# Patient Record
Sex: Female | Born: 1965
Health system: Southern US, Community
[De-identification: ages and names within clinical notes are randomized; demographics above are authoritative.]

## PROBLEM LIST (undated history)

## (undated) DIAGNOSIS — T7840XA Allergy, unspecified, initial encounter: Secondary | ICD-10-CM

## (undated) DIAGNOSIS — J45909 Unspecified asthma, uncomplicated: Secondary | ICD-10-CM

## (undated) DIAGNOSIS — D219 Benign neoplasm of connective and other soft tissue, unspecified: Secondary | ICD-10-CM

## (undated) DIAGNOSIS — Z789 Other specified health status: Secondary | ICD-10-CM

## (undated) HISTORY — DX: Unspecified asthma, uncomplicated: J45.909

## (undated) HISTORY — DX: Allergy, unspecified, initial encounter: T78.40XA

## (undated) HISTORY — DX: Benign neoplasm of connective and other soft tissue, unspecified: D21.9

## (undated) HISTORY — PX: WISDOM TOOTH EXTRACTION: SHX21

---

## 1998-03-29 ENCOUNTER — Other Ambulatory Visit: Admission: RE | Admit: 1998-03-29 | Discharge: 1998-03-29 | Payer: Self-pay | Admitting: Obstetrics and Gynecology

## 1999-04-03 ENCOUNTER — Other Ambulatory Visit: Admission: RE | Admit: 1999-04-03 | Discharge: 1999-04-03 | Payer: Self-pay | Admitting: Obstetrics and Gynecology

## 1999-09-22 ENCOUNTER — Ambulatory Visit (HOSPITAL_COMMUNITY): Admission: AD | Admit: 1999-09-22 | Discharge: 1999-09-22 | Payer: Self-pay | Admitting: Orthopedic Surgery

## 2000-05-15 ENCOUNTER — Other Ambulatory Visit: Admission: RE | Admit: 2000-05-15 | Discharge: 2000-05-15 | Payer: Self-pay | Admitting: Obstetrics and Gynecology

## 2001-01-18 ENCOUNTER — Encounter: Payer: Self-pay | Admitting: Emergency Medicine

## 2001-01-18 ENCOUNTER — Emergency Department (HOSPITAL_COMMUNITY): Admission: EM | Admit: 2001-01-18 | Discharge: 2001-01-18 | Payer: Self-pay | Admitting: Emergency Medicine

## 2001-05-03 ENCOUNTER — Inpatient Hospital Stay (HOSPITAL_COMMUNITY): Admission: AD | Admit: 2001-05-03 | Discharge: 2001-05-03 | Payer: Self-pay | Admitting: Obstetrics

## 2001-05-03 ENCOUNTER — Encounter: Payer: Self-pay | Admitting: *Deleted

## 2001-05-05 ENCOUNTER — Inpatient Hospital Stay (HOSPITAL_COMMUNITY): Admission: AD | Admit: 2001-05-05 | Discharge: 2001-05-05 | Payer: Self-pay | Admitting: *Deleted

## 2002-03-03 ENCOUNTER — Other Ambulatory Visit: Admission: RE | Admit: 2002-03-03 | Discharge: 2002-03-03 | Payer: Self-pay | Admitting: Internal Medicine

## 2002-05-27 ENCOUNTER — Encounter: Admission: RE | Admit: 2002-05-27 | Discharge: 2002-05-27 | Payer: Self-pay

## 2002-05-27 ENCOUNTER — Inpatient Hospital Stay (HOSPITAL_COMMUNITY): Admission: AD | Admit: 2002-05-27 | Discharge: 2002-05-27 | Payer: Self-pay

## 2003-02-08 ENCOUNTER — Inpatient Hospital Stay (HOSPITAL_COMMUNITY): Admission: AD | Admit: 2003-02-08 | Discharge: 2003-02-08 | Payer: Self-pay | Admitting: Gynecology

## 2003-02-26 ENCOUNTER — Inpatient Hospital Stay (HOSPITAL_COMMUNITY): Admission: AD | Admit: 2003-02-26 | Discharge: 2003-02-26 | Payer: Self-pay | Admitting: Gynecology

## 2003-09-27 ENCOUNTER — Other Ambulatory Visit: Admission: RE | Admit: 2003-09-27 | Discharge: 2003-09-27 | Payer: Self-pay | Admitting: Obstetrics and Gynecology

## 2004-10-26 ENCOUNTER — Other Ambulatory Visit: Admission: RE | Admit: 2004-10-26 | Discharge: 2004-10-26 | Payer: Self-pay | Admitting: Obstetrics and Gynecology

## 2005-02-27 ENCOUNTER — Ambulatory Visit (HOSPITAL_COMMUNITY): Admission: RE | Admit: 2005-02-27 | Discharge: 2005-02-27 | Payer: Self-pay | Admitting: Internal Medicine

## 2005-11-08 ENCOUNTER — Other Ambulatory Visit: Admission: RE | Admit: 2005-11-08 | Discharge: 2005-11-08 | Payer: Self-pay | Admitting: Obstetrics and Gynecology

## 2006-01-29 ENCOUNTER — Ambulatory Visit (HOSPITAL_COMMUNITY): Admission: RE | Admit: 2006-01-29 | Discharge: 2006-01-29 | Payer: Self-pay | Admitting: Family Medicine

## 2009-07-12 ENCOUNTER — Encounter: Admission: RE | Admit: 2009-07-12 | Discharge: 2009-07-12 | Payer: Self-pay | Admitting: Obstetrics and Gynecology

## 2010-07-13 ENCOUNTER — Encounter: Admission: RE | Admit: 2010-07-13 | Discharge: 2010-07-13 | Payer: Self-pay | Admitting: Obstetrics and Gynecology

## 2012-03-26 ENCOUNTER — Other Ambulatory Visit: Payer: Self-pay | Admitting: Obstetrics and Gynecology

## 2012-03-26 DIAGNOSIS — Z1231 Encounter for screening mammogram for malignant neoplasm of breast: Secondary | ICD-10-CM

## 2012-03-31 ENCOUNTER — Ambulatory Visit: Payer: Self-pay

## 2012-04-01 ENCOUNTER — Ambulatory Visit
Admission: RE | Admit: 2012-04-01 | Discharge: 2012-04-01 | Disposition: A | Discharge status: Home / Self Care | Payer: BC Managed Care – PPO | Source: Ambulatory Visit | Attending: Obstetrics and Gynecology | Admitting: Obstetrics and Gynecology

## 2012-04-01 DIAGNOSIS — Z1231 Encounter for screening mammogram for malignant neoplasm of breast: Secondary | ICD-10-CM

## 2013-06-23 ENCOUNTER — Other Ambulatory Visit: Payer: Self-pay | Admitting: Obstetrics and Gynecology

## 2013-07-08 ENCOUNTER — Other Ambulatory Visit (HOSPITAL_COMMUNITY): Payer: Self-pay | Admitting: Obstetrics and Gynecology

## 2013-07-08 NOTE — H&P (Signed)
Wanda Gross is a 48 y.o. female P 1-0-6-1 presents for hysteroscopy, dilatation, curettage and endometrial ablation because of menorrhagia and fibroids.  Patient reports that she has always had heavy menstrual periods but over the past two years they have become progressively worse.  Her five day flow requires an overnight pad with a super plus tampon every 1-1.5 hours and even with that protection she has on many occasions soiled her clothing.  Though she experiences cramping rated as a 6/10 on a 10 point pain scale, she just tolerates the pain without analgesia.  She denies intermenstrual bleeding,dyspareunia, or changes in bowel or bladder habits.  An endometrial biopsy in June 2014 failed to reveal any atypia, malignancy or hyperplasia.  A pelvic ultrasound done at that same time showed uterus:6.05 x 7.96 x 5.26 cm with #4 measurable fibroids: posterior-3.54 cm,  posterior-displacing endometrium-2.24 cm, anterior left-3.61 cm and fundal pedunculated-5.51 cm.  Ovaries appeared normal on that study and sono-hysterogram failed to show any endometrial masses.  A review of both medical and surgical options were given to patient regarding the management of her symptoms and radiographic findings however, she has decided to proceed with endometrial ablation in an effort to manage her bleeding.  Past Medical History  OB History:  G7;  P 1-0-6-1;  SVD in 1998  GYN History: menarche: 48 YO;    LMP: 07/01/2011;    Contracepton no method  The patient denies history of sexually transmitted disease.  Denies history of abnormal PAP smear  Last PAP smear:04/2013  Medical History: Vitamin D Deficiency and migraines  Surgical History:  Negative Denies problems with anesthesia or history of blood transfusions  Family History:  Hypertension and Thyroid Disease  Social History:  Married and employed with [employer] in [employer]: Denies tobacco or illicit drug use but occasionally consumes  alcohol   Medication:  NONE  NKDA  Denies sensitivity to peanuts, shellfish, soy, latex or adhesives.   ROS: Admits to glasses but denies dentures,  headache, vision changes, nasal congestion, dysphagia, tinnitus, dizziness, hoarseness, cough,  chest pain, shortness of breath, nausea, vomiting, diarrhea,constipation,  urinary frequency, urgency  dysuria, hematuria, vaginitis symptoms, pelvic pain, swelling of joints,easy bruising,  myalgias, arthralgias, skin rashes, unexplained weight loss and except as is mentioned in the history of present illness, patient's review of systems is otherwise negative.     Physical Exam  Bp : 102/66    P:100 regular    R: 12;    T: 99.1 degrees F;    Weight: 219 lbs;   Height: 5'7.5"     BMI: 33.8  Neck: supple without masses or thyromegaly Lungs: clear to auscultation Heart: regular rate and rhythm Abdomen: soft, non-tender and no organomegaly Pelvic:EGBUS- wnl; vagina-normal rugae; uterus-12-14 weeks size, cervix without lesions or motion tenderness; adnexae-no tenderness or masses Extremities:  no clubbing, cyanosis or edema   Assesment:  Menorrhagia                       Uterine Fibroids   Disposition:  A discussion was held with patient regarding the indication for her procedure(s) along with the risks, which include but are not limited to: reaction to anesthesia, damage to adjacent organs, infection and excessive bleeding. The patient verbalized understanding of these risks and has consented to proceed with Hysteroscopy, Dilatation, Curettage and Endometrial Ablation at [facility], July 24, 2013.   CSN# [id number]   Shaiden Aldous J. Lowell Guitar, PA-C  for Dr.  Woodroe Mode. Su Hilt

## 2013-07-14 ENCOUNTER — Encounter (HOSPITAL_COMMUNITY): Payer: Self-pay | Admitting: Obstetrics and Gynecology

## 2013-07-16 ENCOUNTER — Encounter (HOSPITAL_COMMUNITY): Payer: Self-pay | Admitting: Pharmacy Technician

## 2013-07-24 ENCOUNTER — Encounter (HOSPITAL_COMMUNITY)
Admission: RE | Disposition: A | Discharge status: Home / Self Care | Payer: Self-pay | Source: Ambulatory Visit | Attending: Obstetrics and Gynecology

## 2013-07-24 ENCOUNTER — Ambulatory Visit (HOSPITAL_COMMUNITY): Payer: BC Managed Care – PPO

## 2013-07-24 ENCOUNTER — Encounter (HOSPITAL_COMMUNITY): Payer: Self-pay

## 2013-07-24 ENCOUNTER — Encounter (HOSPITAL_COMMUNITY): Payer: Self-pay | Admitting: *Deleted

## 2013-07-24 ENCOUNTER — Ambulatory Visit (HOSPITAL_COMMUNITY)
Admission: RE | Admit: 2013-07-24 | Discharge: 2013-07-24 | Disposition: A | Discharge status: Home / Self Care | Payer: BC Managed Care – PPO | Source: Ambulatory Visit | Attending: Obstetrics and Gynecology | Admitting: Obstetrics and Gynecology

## 2013-07-24 DIAGNOSIS — N920 Excessive and frequent menstruation with regular cycle: Secondary | ICD-10-CM | POA: Insufficient documentation

## 2013-07-24 HISTORY — DX: Other specified health status: Z78.9

## 2013-07-24 HISTORY — PX: DILITATION & CURRETTAGE/HYSTROSCOPY WITH NOVASURE ABLATION: SHX5568

## 2013-07-24 LAB — CBC
HCT: 33.5 % — ABNORMAL LOW (ref 36.0–46.0)
Hemoglobin: 11.1 g/dL — ABNORMAL LOW (ref 12.0–15.0)
MCH: 26.2 pg (ref 26.0–34.0)
MCHC: 33.1 g/dL (ref 30.0–36.0)
MCV: 79 fL (ref 78.0–100.0)
Platelets: 294 10*3/uL (ref 150–400)
RBC: 4.24 MIL/uL (ref 3.87–5.11)
RDW: 16.4 % — ABNORMAL HIGH (ref 11.5–15.5)
WBC: 5.7 10*3/uL (ref 4.0–10.5)

## 2013-07-24 LAB — HCG, QUANTITATIVE, PREGNANCY: hCG, Beta Chain, Quant, S: <1 m[IU]/mL (ref <5)

## 2013-07-24 SURGERY — DILATATION & CURETTAGE/HYSTEROSCOPY WITH NOVASURE ABLATION
Anesthesia: General | Site: Uterus | Wound class: Clean Contaminated

## 2013-07-24 MED ORDER — LIDOCAINE HCL (CARDIAC) 20 MG/ML IV SOLN
INTRAVENOUS | Status: DC | PRN
  Administered 2013-07-24: 10 mg via INTRAVENOUS

## 2013-07-24 MED ORDER — KETOROLAC TROMETHAMINE 30 MG/ML IJ SOLN
INTRAMUSCULAR | Status: AC
  Filled 2013-07-24: qty 1

## 2013-07-24 MED ORDER — KETOROLAC TROMETHAMINE 30 MG/ML IJ SOLN
INTRAMUSCULAR | Status: DC | PRN
  Administered 2013-07-24: 30 mg via INTRAVENOUS

## 2013-07-24 MED ORDER — MIDAZOLAM HCL 2 MG/2ML IJ SOLN
INTRAMUSCULAR | Status: AC
  Filled 2013-07-24: qty 2

## 2013-07-24 MED ORDER — FENTANYL CITRATE 0.05 MG/ML IJ SOLN
INTRAMUSCULAR | Status: AC
  Filled 2013-07-24: qty 2

## 2013-07-24 MED ORDER — HYDROCODONE-ACETAMINOPHEN 5-300 MG PO TABS: 1.0000 | ORAL_TABLET | Freq: Four times a day (QID) | ORAL | Status: DC | PRN

## 2013-07-24 MED ORDER — FENTANYL CITRATE 0.05 MG/ML IJ SOLN: 25.0000 ug | INTRAMUSCULAR | Status: DC | PRN

## 2013-07-24 MED ORDER — METOCLOPRAMIDE HCL 5 MG/ML IJ SOLN: 10.0000 mg | Freq: Once | INTRAMUSCULAR | Status: DC | PRN

## 2013-07-24 MED ORDER — GLYCOPYRROLATE 0.2 MG/ML IJ SOLN
INTRAMUSCULAR | Status: AC
  Filled 2013-07-24: qty 1

## 2013-07-24 MED ORDER — IBUPROFEN 600 MG PO TABS: 600.0000 mg | ORAL_TABLET | Freq: Four times a day (QID) | ORAL | Status: DC | PRN

## 2013-07-24 MED ORDER — MEPERIDINE HCL 25 MG/ML IJ SOLN: 6.2500 mg | INTRAMUSCULAR | Status: DC | PRN

## 2013-07-24 MED ORDER — PROPOFOL 10 MG/ML IV BOLUS
INTRAVENOUS | Status: DC | PRN
  Administered 2013-07-24: 200 mg via INTRAVENOUS

## 2013-07-24 MED ORDER — DEXAMETHASONE SODIUM PHOSPHATE 10 MG/ML IJ SOLN
INTRAMUSCULAR | Status: DC | PRN
  Administered 2013-07-24: 10 mg via INTRAVENOUS

## 2013-07-24 MED ORDER — ONDANSETRON HCL 4 MG/2ML IJ SOLN
INTRAMUSCULAR | Status: AC
  Filled 2013-07-24: qty 2

## 2013-07-24 MED ORDER — LIDOCAINE HCL (CARDIAC) 20 MG/ML IV SOLN
INTRAVENOUS | Status: AC
  Filled 2013-07-24: qty 5

## 2013-07-24 MED ORDER — ONDANSETRON HCL 4 MG/2ML IJ SOLN
INTRAMUSCULAR | Status: DC | PRN
  Administered 2013-07-24: 4 mg via INTRAVENOUS

## 2013-07-24 MED ORDER — DEXAMETHASONE SODIUM PHOSPHATE 10 MG/ML IJ SOLN
INTRAMUSCULAR | Status: AC
  Filled 2013-07-24: qty 1

## 2013-07-24 MED ORDER — LACTATED RINGERS IR SOLN
Status: DC | PRN
  Administered 2013-07-24: 3000 mL

## 2013-07-24 MED ORDER — GLYCOPYRROLATE 0.2 MG/ML IJ SOLN
INTRAMUSCULAR | Status: DC | PRN
  Administered 2013-07-24: 0.2 mg via INTRAVENOUS

## 2013-07-24 MED ORDER — FENTANYL CITRATE 0.05 MG/ML IJ SOLN
INTRAMUSCULAR | Status: DC | PRN
  Administered 2013-07-24: 100 ug via INTRAVENOUS

## 2013-07-24 MED ORDER — LIDOCAINE HCL 1 % IJ SOLN
INTRAMUSCULAR | Status: DC | PRN
  Administered 2013-07-24: 10 mL

## 2013-07-24 MED ORDER — FENTANYL CITRATE 0.05 MG/ML IJ SOLN
INTRAMUSCULAR | Status: AC
  Administered 2013-07-24: 50 ug via INTRAVENOUS
  Filled 2013-07-24: qty 2

## 2013-07-24 MED ORDER — LACTATED RINGERS IV SOLN
INTRAVENOUS | Status: DC
  Administered 2013-07-24: 09:00:00 via INTRAVENOUS

## 2013-07-24 MED ORDER — MIDAZOLAM HCL 5 MG/5ML IJ SOLN
INTRAMUSCULAR | Status: DC | PRN
  Administered 2013-07-24: 2 mg via INTRAVENOUS

## 2013-07-24 SURGICAL SUPPLY — 14 items
ABLATOR ENDOMETRIAL BIPOLAR (ABLATOR) ×2 IMPLANT
CATH ROBINSON RED A/P 16FR (CATHETERS) ×2 IMPLANT
CLOTH BEACON ORANGE TIMEOUT ST (SAFETY) ×2 IMPLANT
CONTAINER PREFILL 10% NBF 60ML (FORM) ×4 IMPLANT
DRESSING TELFA 8X3 (GAUZE/BANDAGES/DRESSINGS) ×2 IMPLANT
GLOVE BIO SURGEON STRL SZ7.5 (GLOVE) ×2 IMPLANT
GLOVE BIOGEL PI IND STRL 7.5 (GLOVE) ×1 IMPLANT
GLOVE BIOGEL PI INDICATOR 7.5 (GLOVE) ×1
GOWN STRL REIN XL XLG (GOWN DISPOSABLE) ×6 IMPLANT
PACK HYSTEROSCOPY LF (CUSTOM PROCEDURE TRAY) ×2 IMPLANT
PAD OB MATERNITY 4.3X12.25 (PERSONAL CARE ITEMS) ×2 IMPLANT
PAD PREP 24X48 CUFFED NSTRL (MISCELLANEOUS) ×2 IMPLANT
TOWEL OR 17X24 6PK STRL BLUE (TOWEL DISPOSABLE) ×4 IMPLANT
WATER STERILE IRR 1000ML POUR (IV SOLUTION) ×2 IMPLANT

## 2013-07-24 NOTE — Interval H&P Note (Signed)
History and Physical Interval Note:  07/24/2013 9:33 AM  Wanda Gross  has presented today for surgery, with the diagnosis of Fibroids, Menorrhagia  The various methods of treatment have been discussed with the patient and family. After consideration of risks, benefits and other options for treatment, the patient has consented to  Procedure(s): D&C;HYSTEROSCOPY WITH ABLATION;POSS. RESECTION (N/A) as a surgical intervention .  The patient's history has been reviewed, patient examined, no change in status, stable for surgery.  I have reviewed the patient's chart and labs.  Questions were answered to the patient's satisfaction.     Purcell Nails

## 2013-07-24 NOTE — H&P (View-Only) (Signed)
Wanda Gross is a 47 y.o. female P 1-0-6-1 presents for hysteroscopy, dilatation, curettage and endometrial ablation because of menorrhagia and fibroids.  Patient reports that she has always had heavy menstrual periods but over the past two years they have become progressively worse.  Her five day flow requires an overnight pad with a super plus tampon every 1-1.5 hours and even with that protection she has on many occasions soiled her clothing.  Though she experiences cramping rated as a 6/10 on a 10 point pain scale, she just tolerates the pain without analgesia.  She denies intermenstrual bleeding,dyspareunia, or changes in bowel or bladder habits.  An endometrial biopsy in June 2014 failed to reveal any atypia, malignancy or hyperplasia.  A pelvic ultrasound done at that same time showed uterus:6.05 x 7.96 x 5.26 cm with #4 measurable fibroids: posterior-3.54 cm,  posterior-displacing endometrium-2.24 cm, anterior left-3.61 cm and fundal pedunculated-5.51 cm.  Ovaries appeared normal on that study and sono-hysterogram failed to show any endometrial masses.  A review of both medical and surgical options were given to patient regarding the management of her symptoms and radiographic findings however, she has decided to proceed with endometrial ablation in an effort to manage her bleeding.  Past Medical History  OB History:  G7;  P 1-0-6-1;  SVD in 1998  GYN History: menarche: 48 YO;    LMP: 07/01/2011;    Contracepton no method  The patient denies history of sexually transmitted disease.  Denies history of abnormal PAP smear  Last PAP smear:04/2013  Medical History: Vitamin D Deficiency and migraines  Surgical History:  Negative Denies problems with anesthesia or history of blood transfusions  Family History:  Hypertension and Thyroid Disease  Social History:  Married and employed with [employer] in [employer]: Denies tobacco or illicit drug use but occasionally consumes  alcohol   Medication:  NONE  NKDA  Denies sensitivity to peanuts, shellfish, soy, latex or adhesives.   ROS: Admits to glasses but denies dentures,  headache, vision changes, nasal congestion, dysphagia, tinnitus, dizziness, hoarseness, cough,  chest pain, shortness of breath, nausea, vomiting, diarrhea,constipation,  urinary frequency, urgency  dysuria, hematuria, vaginitis symptoms, pelvic pain, swelling of joints,easy bruising,  myalgias, arthralgias, skin rashes, unexplained weight loss and except as is mentioned in the history of present illness, patient's review of systems is otherwise negative.     Physical Exam  Bp : 102/66    P:100 regular    R: 12;    T: 99.1 degrees F;    Weight: 219 lbs;   Height: 5'7.5"     BMI: 33.8  Neck: supple without masses or thyromegaly Lungs: clear to auscultation Heart: regular rate and rhythm Abdomen: soft, non-tender and no organomegaly Pelvic:EGBUS- wnl; vagina-normal rugae; uterus-12-14 weeks size, cervix without lesions or motion tenderness; adnexae-no tenderness or masses Extremities:  no clubbing, cyanosis or edema   Assesment:  Menorrhagia                       Uterine Fibroids   Disposition:  A discussion was held with patient regarding the indication for her procedure(s) along with the risks, which include but are not limited to: reaction to anesthesia, damage to adjacent organs, infection and excessive bleeding. The patient verbalized understanding of these risks and has consented to proceed with Hysteroscopy, Dilatation, Curettage and Endometrial Ablation at [facility], July 24, 2013.   CSN# [id number]   Sahith Nurse J. Alaijah Gibler, PA-C  for Dr.   Angela Y. Roberts    

## 2013-07-24 NOTE — Anesthesia Preprocedure Evaluation (Signed)
Anesthesia Evaluation  Patient identified by MRN, date of birth, ID band Patient awake    Reviewed: Allergy & Precautions, H&P , NPO status , Patient's Chart, lab work & pertinent test results  Airway Mallampati: III TM Distance: >3 FB Neck ROM: Full    Dental no notable dental hx. (+) Teeth Intact   Pulmonary neg pulmonary ROS,  breath sounds clear to auscultation  Pulmonary exam normal       Cardiovascular negative cardio ROS  Rhythm:Regular Rate:Normal     Neuro/Psych negative neurological ROS  negative psych ROS   GI/Hepatic negative GI ROS, Neg liver ROS,   Endo/Other  negative endocrine ROS  Renal/GU negative Renal ROS  negative genitourinary   Musculoskeletal negative musculoskeletal ROS (+)   Abdominal Normal abdominal exam  (+) + obese,   Peds negative pediatric ROS (+)  Hematology negative hematology ROS (+)   Anesthesia Other Findings   Reproductive/Obstetrics Menorrhagia Uterine Fibroids                           Anesthesia Physical Anesthesia Plan  ASA: II  Anesthesia Plan: General   Post-op Pain Management:    Induction: Intravenous  Airway Management Planned: LMA  Additional Equipment:   Intra-op Plan:   Post-operative Plan: Extubation in OR  Informed Consent: I have reviewed the patients History and Physical, chart, labs and discussed the procedure including the risks, benefits and alternatives for the proposed anesthesia with the patient or authorized representative who has indicated his/her understanding and acceptance.   Dental advisory given  Plan Discussed with: Anesthesiologist, CRNA and Surgeon  Anesthesia Plan Comments:         Anesthesia Quick Evaluation

## 2013-07-24 NOTE — Transfer of Care (Signed)
Immediate Anesthesia Transfer of Care Note  Patient: Wanda Gross  Procedure(s) Performed: Procedure(s): D&C;HYSTEROSCOPY WITH NOVASURE;POSS. RESECTION (N/A)  Patient Location: PACU  Anesthesia Type:General  Level of Consciousness: awake, alert  and oriented  Airway & Oxygen Therapy: Patient Spontanous Breathing and Patient connected to nasal cannula oxygen  Post-op Assessment: Report given to PACU RN and Post -op Vital signs reviewed and stable  Post vital signs: Reviewed and stable  Complications: No apparent anesthesia complications

## 2013-07-24 NOTE — Anesthesia Postprocedure Evaluation (Signed)
  Anesthesia Post-op Note  Patient: Wanda Gross  Procedure(s) Performed: Procedure(s): D&C;HYSTEROSCOPY WITH NOVASURE;POSS. RESECTION (N/A)  Patient Location: PACU  Anesthesia Type:General  Level of Consciousness: awake, alert  and oriented  Airway and Oxygen Therapy: Patient Spontanous Breathing  Post-op Pain: mild  Post-op Assessment: Post-op Vital signs reviewed, Patient's Cardiovascular Status Stable, Respiratory Function Stable, Patent Airway, No signs of Nausea or vomiting and Pain level controlled  Post-op Vital Signs: Reviewed and stable  Complications: No apparent anesthesia complications

## 2013-07-24 NOTE — Anesthesia Procedure Notes (Signed)
Procedure Name: LMA Insertion Date/Time: 07/24/2013 9:50 AM Performed by: Lincoln Brigham Pre-anesthesia Checklist: Patient identified, Patient being monitored, Emergency Drugs available, Timeout performed and Suction available Patient Re-evaluated:Patient Re-evaluated prior to inductionOxygen Delivery Method: Circle system utilized Preoxygenation: Pre-oxygenation with 100% oxygen Intubation Type: IV induction Ventilation: Mask ventilation without difficulty LMA: LMA inserted Tube size: 4.0 mm Number of attempts: 1 Placement Confirmation: positive ETCO2 and breath sounds checked- equal and bilateral Tube secured with: Tape Dental Injury: Teeth and Oropharynx as per pre-operative assessment

## 2013-07-24 NOTE — Op Note (Signed)
Preop Diagnosis: Fibroids, Menorrhagia   Postop Diagnosis: Fibroids, Menorrhagia   Procedure: DILATATION & CURETTAGE/HYSTEROSCOPY WITH NOVASURE ABLATION   Anesthesia: Choice    Attending: Purcell Nails, MD   Assistant: N/a  Findings: No obvious intracavitary lesions.  Uterine Sound 3.5cm, Cervical length 10cm, Cavity length 6.5cm, Cavity Width 4.6cm, Power 164watts, Time 54 secs.  Pathology: Endometrial Curretings  Fluids: See flowsheet  Hysteroscopic Fluid Deficit: None  UOP: See flowsheet  EBL: Minimal  Complications: None  Procedure:The patient was taken to the operating room after the risks, benefits and alternatives discussed with the patient. The patient verbalized understanding and consent signed and witnessed. The patient was given a spinal per anesthesia and prepped and draped in the normal sterile fashion and Time Out performed per protocol. A bivalve speculum was placed in the patient's vagina and the anterior lip of the cervix was grasped with a single tooth tenaculum. A paracervical block was administered using a total of 10 cc of 1% lidocaine. The uterus sounded to 10 cm. The cervix was dilated for passage of the hysteroscope. The hysteroscope was introduced into the uterine cavity and findings as noted above. Sharp curettage was performed until a gritty texture was noted and currettings sent to pathology. The hysteroscope was reintroduced and no obvious remaining intracavitary lesions were noted. The Novasure instrument was introduced and ablation performed without difficulty. Hysteroscope reintroduced and good ablation results were noted. All instruments were removed. Sponge lap and needle count was correct. The patient tolerated the procedure well and was returned to the recovery room in good condition.

## 2013-07-27 ENCOUNTER — Encounter (HOSPITAL_COMMUNITY): Payer: Self-pay | Admitting: Obstetrics and Gynecology

## 2013-08-06 ENCOUNTER — Other Ambulatory Visit: Payer: Self-pay

## 2013-08-06 DIAGNOSIS — Z1231 Encounter for screening mammogram for malignant neoplasm of breast: Secondary | ICD-10-CM

## 2013-08-21 ENCOUNTER — Ambulatory Visit
Admission: RE | Admit: 2013-08-21 | Discharge: 2013-08-21 | Disposition: A | Discharge status: Home / Self Care | Payer: BC Managed Care – PPO | Source: Ambulatory Visit

## 2013-08-21 DIAGNOSIS — Z1231 Encounter for screening mammogram for malignant neoplasm of breast: Secondary | ICD-10-CM

## 2014-01-05 ENCOUNTER — Emergency Department (HOSPITAL_COMMUNITY)
Admission: EM | Admit: 2014-01-05 | Discharge: 2014-01-05 | Disposition: A | Discharge status: Home / Self Care | Payer: BC Managed Care – PPO | Source: Home / Self Care | Attending: Family Medicine | Admitting: Family Medicine

## 2014-01-05 ENCOUNTER — Encounter (HOSPITAL_COMMUNITY): Payer: Self-pay | Admitting: Emergency Medicine

## 2014-01-05 DIAGNOSIS — B9789 Other viral agents as the cause of diseases classified elsewhere: Principal | ICD-10-CM

## 2014-01-05 DIAGNOSIS — J069 Acute upper respiratory infection, unspecified: Secondary | ICD-10-CM

## 2014-01-05 LAB — POCT RAPID STREP A: Streptococcus, Group A Screen (Direct): NEGATIVE

## 2014-01-05 MED ORDER — PROMETHAZINE-CODEINE 6.25-10 MG/5ML PO SYRP: 5.0000 mL | ORAL_SOLUTION | Freq: Every evening | ORAL | Status: DC | PRN

## 2014-01-05 MED ORDER — ANTIPYRINE-BENZOCAINE 5.4-1.4 % OT SOLN: 3.0000 [drp] | OTIC | Status: DC | PRN

## 2014-01-05 MED ORDER — IPRATROPIUM BROMIDE 0.06 % NA SOLN: 2.0000 | Freq: Four times a day (QID) | NASAL | Status: DC

## 2014-01-05 NOTE — Discharge Instructions (Signed)
Thank you for coming in today. Take up to 2 Aleve twice daily for pain fevers chills and body aches. Use Atrovent nasal spray. Use Auralgan ear drops as needed for ear pain. Use codeine containing cough medication especially at bedtime as needed. Use over-the-counter oral decongestants to help relieve the ear compression. Followup with your Dr. if not getting better Call or go to the emergency room if you get worse, have trouble breathing, have chest pains, or palpitations.

## 2014-01-05 NOTE — ED Provider Notes (Signed)
Wanda Gross is a 49 y.o. female who presents to Urgent Care today for ear pain sore throat cough congestion and wheezing. Patient tried multiple over-the-counter medications which have not helped much. She denies any shortness of breath nausea vomiting or diarrhea. She denies any chest pain. She feels well otherwise. Symptoms are moderate. No exacerbating factors.   Past Medical History  Diagnosis Date  . Medical history non-contributory    History  Substance Use Topics  . Smoking status: Never Smoker   . Smokeless tobacco: Not on file  . Alcohol Use: Yes     Comment: social   ROS as above Medications reviewed. No current facility-administered medications for this encounter.   Current Outpatient Prescriptions  Medication Sig Dispense Refill  . antipyrine-benzocaine (AURALGAN) otic solution Place 3-4 drops into the right ear every 2 (two) hours as needed for ear pain.  10 mL  0  . ipratropium (ATROVENT) 0.06 % nasal spray Place 2 sprays into both nostrils 4 (four) times daily.  15 mL  1  . promethazine-codeine (PHENERGAN WITH CODEINE) 6.25-10 MG/5ML syrup Take 5 mLs by mouth at bedtime as needed for cough.  120 mL  0  . Vitamin D, Ergocalciferol, (DRISDOL) 50000 UNITS CAPS Take 50,000 Units by mouth 2 (two) times a week.        Exam:  BP 105/80  Pulse 112  Temp(Src) 99.6 F (37.6 C) (Oral)  Resp 18  SpO2 98% Gen: Well NAD HEENT: EOMI,  MMM normal appearing tympanic membranes bilaterally. Posterior pharynx is normal appearing. Lungs: Normal work of breathing. CTABL Heart: Mild tachycardia but regular no MRG Abd: NABS, Soft. NT, ND Exts: Non edematous BL  LE, warm and well perfused.   Results for orders placed during the hospital encounter of 01/05/14 (from the past 24 hour(s))  POCT RAPID STREP A ([place])     Status: None   Collection Time    01/05/14  9:26 AM      Result Value Range   Streptococcus, Group A Screen (Direct) NEGATIVE  NEGATIVE   No results  found.  Assessment and Plan: 49 y.o. female with viral URI. Plan to treat with Atrovent nasal spray and codeine containing cough medication. Additionally use over-the-counter NSAIDs for pain control. Patient has ear pain with normal appearance of tympanic membranes. This is likely due to moderate traction or eustachian tube dysfunction. Recommend Auralgan ear drops as needed. Discussed warning signs or symptoms. Please see discharge instructions. Patient expresses understanding.      Gregor Hams, MD 01/05/14 [phone]

## 2014-01-05 NOTE — ED Notes (Signed)
C/o cough with mucous, sore throat, sneezing, earache in both ears, more on the right than left, body aches, and sore throat that started 4 days ago. Pain is 4/10. Stated she has had some n/v/d, but not much.  Written by: Lenore Manner

## 2014-01-07 LAB — CULTURE, GROUP A STREP

## 2014-10-05 ENCOUNTER — Other Ambulatory Visit: Payer: Self-pay

## 2014-10-05 DIAGNOSIS — Z1231 Encounter for screening mammogram for malignant neoplasm of breast: Secondary | ICD-10-CM

## 2014-10-19 ENCOUNTER — Ambulatory Visit
Admission: RE | Admit: 2014-10-19 | Discharge: 2014-10-19 | Disposition: A | Discharge status: Home / Self Care | Payer: BC Managed Care – PPO | Source: Ambulatory Visit

## 2014-10-19 DIAGNOSIS — Z1231 Encounter for screening mammogram for malignant neoplasm of breast: Secondary | ICD-10-CM

## 2015-07-31 ENCOUNTER — Encounter (HOSPITAL_COMMUNITY): Payer: Self-pay

## 2015-07-31 ENCOUNTER — Emergency Department (HOSPITAL_COMMUNITY)
Admission: EM | Admit: 2015-07-31 | Discharge: 2015-07-31 | Disposition: A | Discharge status: Home / Self Care | Payer: BLUE CROSS/BLUE SHIELD | Attending: Emergency Medicine | Admitting: Emergency Medicine

## 2015-07-31 ENCOUNTER — Emergency Department (HOSPITAL_COMMUNITY): Payer: BLUE CROSS/BLUE SHIELD

## 2015-07-31 DIAGNOSIS — J9801 Acute bronchospasm: Secondary | ICD-10-CM | POA: Insufficient documentation

## 2015-07-31 DIAGNOSIS — Z79899 Other long term (current) drug therapy: Secondary | ICD-10-CM | POA: Insufficient documentation

## 2015-07-31 DIAGNOSIS — R0602 Shortness of breath: Secondary | ICD-10-CM | POA: Diagnosis present

## 2015-07-31 LAB — CBC
HEMATOCRIT: 35.9 % — AB (ref 36.0–46.0)
Hemoglobin: 11.8 g/dL — ABNORMAL LOW (ref 12.0–15.0)
MCH: 28.1 pg (ref 26.0–34.0)
MCHC: 32.9 g/dL (ref 30.0–36.0)
MCV: 85.5 fL (ref 78.0–100.0)
Platelets: 227 10*3/uL (ref 150–400)
RBC: 4.2 MIL/uL (ref 3.87–5.11)
RDW: 14.8 % (ref 11.5–15.5)
WBC: 6.8 10*3/uL (ref 4.0–10.5)

## 2015-07-31 LAB — BASIC METABOLIC PANEL
Anion gap: 8 (ref 5–15)
BUN: 17 mg/dL (ref 6–20)
CALCIUM: 9.1 mg/dL (ref 8.9–10.3)
CO2: 23 mmol/L (ref 22–32)
Chloride: 105 mmol/L (ref 101–111)
Creatinine, Ser: 0.81 mg/dL (ref 0.44–1.00)
GLUCOSE: 92 mg/dL (ref 65–99)
Potassium: 3.9 mmol/L (ref 3.5–5.1)
Sodium: 136 mmol/L (ref 135–145)

## 2015-07-31 LAB — I-STAT TROPONIN, ED: Troponin i, poc: 0 ng/mL (ref 0.00–0.08)

## 2015-07-31 LAB — BRAIN NATRIURETIC PEPTIDE: B NATRIURETIC PEPTIDE 5: 48.2 pg/mL (ref 0.0–100.0)

## 2015-07-31 MED ORDER — IPRATROPIUM-ALBUTEROL 0.5-2.5 (3) MG/3ML IN SOLN
3.0000 mL | Freq: Once | RESPIRATORY_TRACT | Status: AC
  Administered 2015-07-31: 3 mL via RESPIRATORY_TRACT
  Filled 2015-07-31: qty 3

## 2015-07-31 MED ORDER — ALBUTEROL SULFATE HFA 108 (90 BASE) MCG/ACT IN AERS: 2.0000 | INHALATION_SPRAY | Freq: Four times a day (QID) | RESPIRATORY_TRACT | Status: DC | PRN

## 2015-07-31 NOTE — ED Provider Notes (Signed)
CSN: [id number]     Arrival date & time 07/31/15  0227 History   First MD Initiated Contact with Patient 07/31/15 [phone]     Chief Complaint  Patient presents with  . Shortness of Breath     (Consider location/radiation/quality/duration/timing/severity/associated sxs/prior Treatment) HPI Comments: Patient is a 50 yo F PMHx significant for seasonal allergies presenting to the ED for evaluation of shortness of breath that began around midnight. Patient states she is having associated chest tightness and pressure with the SOB. Denies any CP, nausea, vomiting, diaphoresis, syncope. No modifying factors identified. Patient states standing up alleviates her symptoms mildly. She states she has had a few days of allergy symptoms, rhinorrhea, non-productive cough. No history of similar symptoms. No cardiac history. No early familial cardiac history. No history of DVT/PE   Past Medical History  Diagnosis Date  . Medical history non-contributory    Past Surgical History  Procedure Laterality Date  . Wisdom tooth extraction    . Dilitation & currettage/hystroscopy with novasure ablation N/A 07/24/2013    Procedure: D&C;HYSTEROSCOPY WITH NOVASURE;POSS. RESECTION;  Surgeon: Delice Lesch, MD;  Location: [facility];  Service: Gynecology;  Laterality: N/A;   No family history on file. History  Substance Use Topics  . Smoking status: Never Smoker   . Smokeless tobacco: Not on file  . Alcohol Use: Yes     Comment: social   OB History    No data available     Review of Systems  HENT: Positive for congestion.   Respiratory: Positive for shortness of breath.   All other systems reviewed and are negative.     Allergies  Review of patient's allergies indicates no known allergies.  Home Medications   Prior to Admission medications   Medication Sig Start Date End Date Taking? Authorizing Provider  Chlorpheniramine-Phenylephrine (SINUS & ALLERGY PO) Take 1 tablet by mouth at bedtime as needed  (for congestion).   Yes Historical Provider, MD  albuterol (PROVENTIL HFA;VENTOLIN HFA) 108 (90 BASE) MCG/ACT inhaler Inhale 2 puffs into the lungs every 6 (six) hours as needed for wheezing or shortness of breath. 07/31/15   Baron Sane, PA-C  antipyrine-benzocaine Toniann Fail) otic solution Place 3-4 drops into the right ear every 2 (two) hours as needed for ear pain. Patient not taking: Reported on 07/31/2015 01/05/14   Gregor Hams, MD  ipratropium (ATROVENT) 0.06 % nasal spray Place 2 sprays into both nostrils 4 (four) times daily. Patient not taking: Reported on 07/31/2015 01/05/14   Gregor Hams, MD  promethazine-codeine [facility] WITH CODEINE) 6.25-10 MG/5ML syrup Take 5 mLs by mouth at bedtime as needed for cough. Patient not taking: Reported on 07/31/2015 01/05/14   Gregor Hams, MD   BP 105/65 mmHg  Pulse 74  Temp(Src) 98.7 F (37.1 C) (Oral)  Resp 18  Ht 5\' 7"  (1.702 m)  Wt 211 lb (95.709 kg)  BMI 33.04 kg/m2  SpO2 100% Physical Exam  Constitutional: She is oriented to person, place, and time. She appears well-developed and well-nourished.  HENT:  Head: Normocephalic and atraumatic.  Right Ear: External ear normal.  Left Ear: External ear normal.  Nose: Nose normal.  Eyes: Conjunctivae are normal.  Neck: Neck supple.  Cardiovascular: Normal rate, regular rhythm and normal heart sounds.   Pulmonary/Chest: Effort normal. No respiratory distress. She has no wheezes. She exhibits no tenderness.  Breath sounds slightly diminished  Abdominal: Soft. There is no tenderness.  Musculoskeletal: Normal range of motion. She exhibits no edema.  Neurological: She is alert and oriented to person, place, and time.  Skin: Skin is warm and dry.  Nursing note and vitals reviewed.   ED Course  Procedures (including critical care time) Medications  ipratropium-albuterol (DUONEB) 0.5-2.5 (3) MG/3ML nebulizer solution 3 mL (3 mLs Nebulization Given 07/31/15 0340)  ipratropium-albuterol  (DUONEB) 0.5-2.5 (3) MG/3ML nebulizer solution 3 mL (3 mLs Nebulization Given 07/31/15 0439)    Labs Review Labs Reviewed  CBC - Abnormal; Notable for the following:    Hemoglobin 11.8 (*)    HCT 35.9 (*)    All other components within normal limits  BASIC METABOLIC PANEL  BRAIN NATRIURETIC PEPTIDE  I-STAT TROPOININ, ED    Imaging Review Dg Chest 2 View  07/31/2015   CLINICAL DATA:  Shortness of breath for 2 hours.  EXAM: CHEST  2 VIEW  COMPARISON:  None.  FINDINGS: The cardiomediastinal contours are normal. Mild bibasilar atelectasis. Pulmonary vasculature is normal. No consolidation, pleural effusion, or pneumothorax. No acute osseous abnormalities are seen.  IMPRESSION: Mild bibasilar atelectasis.   Electronically Signed   By: Jeb Levering M.D.   On: 07/31/2015 03:16     EKG Interpretation None      5:29 AM Patient feeling better two duoneb administrations.  MDM   Final diagnoses:  Bronchospasm   Filed Vitals:   07/31/15 0547  BP:   Pulse:   Temp: 98.7 F (37.1 C)  Resp:    Afebrile, NAD, non-toxic appearing, AAOx4.  I have reviewed nursing notes, vital signs, and all lab and all imaging results as noted above. Patient is to be discharged with recommendation to follow up with PCP in regards to today's hospital visit. SOB is not likely of cardiac etiology d/t presentation, low clinical suspicion for PE, VSS, no tracheal deviation, no JVD or new murmur, RRR, breath sounds equal bilaterally, EKG without acute abnormalities, negative troponin, and negative CXR. Symptoms consistent with bronchospasm. Pt has been advised to use inhaler and return to the ED is CP becomes exertional, associated with diaphoresis or nausea, radiates to left jaw/arm, worsens or becomes concerning in any way. Pt appears reliable for follow up and is agreeable to discharge.   Case has been discussed with Dr. Venora Maples who agrees with the above plan to discharge.      Baron Sane,  PA-C 07/31/15 [place], MD 08/01/15 [phone]

## 2015-07-31 NOTE — ED Notes (Signed)
Pt states she was lying down trying to go to sleep and started feeling sudden onset SOB and right feels heavy. States she has a "different" feeling in her chest but doesn't describe it as a pain. Pt denies any stress.

## 2015-07-31 NOTE — Discharge Instructions (Signed)
Please follow up with your primary care physician in 1-2 days. If you do not have one please call the [facility] number listed above.Please read all discharge instructions and return precautions.    Bronchospasm A bronchospasm is a spasm or tightening of the airways going into the lungs. During a bronchospasm breathing becomes more difficult because the airways get smaller. When this happens there can be coughing, a whistling sound when breathing (wheezing), and difficulty breathing. Bronchospasm is often associated with asthma, but not all patients who experience a bronchospasm have asthma. CAUSES  A bronchospasm is caused by inflammation or irritation of the airways. The inflammation or irritation may be triggered by:   Allergies (such as to animals, pollen, food, or mold). Allergens that cause bronchospasm may cause wheezing immediately after exposure or many hours later.   Infection. Viral infections are believed to be the most common cause of bronchospasm.   Exercise.   Irritants (such as pollution, cigarette smoke, strong odors, aerosol sprays, and paint fumes).   Weather changes. Winds increase molds and pollens in the air. Rain refreshes the air by washing irritants out. Cold air may cause inflammation.   Stress and emotional upset.  SIGNS AND SYMPTOMS   Wheezing.   Excessive nighttime coughing.   Frequent or severe coughing with a simple cold.   Chest tightness.   Shortness of breath.  DIAGNOSIS  Bronchospasm is usually diagnosed through a history and physical exam. Tests, such as chest X-rays, are sometimes done to look for other conditions. TREATMENT   Inhaled medicines can be given to open up your airways and help you breathe. The medicines can be given using either an inhaler or a nebulizer machine.  Corticosteroid medicines may be given for severe bronchospasm, usually when it is associated with asthma. HOME CARE INSTRUCTIONS    Always have a plan prepared for seeking medical care. Know when to call your health care provider and local emergency services (911 in the [place] Know where you can access local emergency care.  Only take medicines as directed by your health care provider.  If you were prescribed an inhaler or nebulizer machine, ask your health care provider to explain how to use it correctly. Always use a spacer with your inhaler if you were given one.  It is necessary to remain calm during an attack. Try to relax and breathe more slowly.  Control your home environment in the following ways:   Change your heating and air conditioning filter at least once a month.   Limit your use of fireplaces and wood stoves.  Do not smoke and do not allow smoking in your home.   Avoid exposure to perfumes and fragrances.   Get rid of pests (such as roaches and mice) and their droppings.   Throw away plants if you see mold on them.   Keep your house clean and dust free.   Replace carpet with wood, tile, or vinyl flooring. Carpet can trap dander and dust.   Use allergy-proof pillows, mattress covers, and box spring covers.   Wash bed sheets and blankets every week in hot water and dry them in a dryer.   Use blankets that are made of polyester or cotton.   Wash hands frequently. SEEK MEDICAL CARE IF:   You have muscle aches.   You have chest pain.   The sputum changes from clear or white to yellow, green, gray, or bloody.   The sputum you cough up gets thicker.  There are problems that may be related to the medicine you are given, such as a rash, itching, swelling, or trouble breathing.  SEEK IMMEDIATE MEDICAL CARE IF:   You have worsening wheezing and coughing even after taking your prescribed medicines.   You have increased difficulty breathing.   You develop severe chest pain. MAKE SURE YOU:   Understand these instructions.  Will watch your condition.  Will get help  right away if you are not doing well or get worse. Document Released: 12/20/2003 Document Revised: 12/22/2013 Document Reviewed: 06/08/2013 [facility] Patient Information 2015 [place], [place]. This information is not intended to replace advice given to you by your health care provider. Make sure you discuss any questions you have with your health care provider.

## 2015-11-28 LAB — RESULTS CONSOLE HPV: CHL HPV: NEGATIVE

## 2015-11-29 LAB — HM PAP SMEAR: HM Pap smear: NEGATIVE

## 2016-05-09 DIAGNOSIS — H61112 Acquired deformity of pinna, left ear: Secondary | ICD-10-CM | POA: Diagnosis not present

## 2016-08-05 ENCOUNTER — Ambulatory Visit (INDEPENDENT_AMBULATORY_CARE_PROVIDER_SITE_OTHER): Payer: Self-pay | Admitting: Nurse Practitioner

## 2016-08-05 VITALS — BP 100/70 | HR 69 | Temp 98.5°F | Wt 215.6 lb

## 2016-08-05 DIAGNOSIS — J029 Acute pharyngitis, unspecified: Secondary | ICD-10-CM

## 2016-08-05 DIAGNOSIS — J069 Acute upper respiratory infection, unspecified: Secondary | ICD-10-CM

## 2016-08-05 MED ORDER — PROMETHAZINE-CODEINE 6.25-10 MG/5ML PO SYRP: 5.0000 mL | ORAL_SOLUTION | Freq: Four times a day (QID) | ORAL | 0 refills | Status: AC | PRN

## 2016-08-05 MED ORDER — AZITHROMYCIN 250 MG PO TABS: ORAL_TABLET | ORAL | 0 refills | Status: AC

## 2016-08-05 NOTE — Progress Notes (Signed)
   Subjective:    Patient ID: Wanda Gross, female    DOB: 1965/09/13, 51 y.o.   MRN: [id number]  HPI The patient is a 51 y.o. AA female that presents for complaints of sore throat x 5-6 days.  The patient states she first noticed the pain early last week, but the her throat pain has progressively worsened.  The patient describes the pain as burning and states it is difficult for her to swallow.  The patient rates the pain 8/10 at present.  She is concerned because this has persisted and she is going out of town to the [place].  The patient c/o questionable fever, chills, headache, fatigue, bilateral ear pressure, congestion and a productive cough.  The patient denies eye complaints, dizziness, SOB, nausea, vomiting or diarrhea.  She has been using OTC medications without relief.  Review of Systems  Constitutional: Positive for chills, fatigue and fever. Negative for diaphoresis.  HENT: Positive for congestion, ear pain, rhinorrhea, sinus pressure, sore throat and trouble swallowing. Negative for postnasal drip and voice change.   Eyes: Negative.   Respiratory: Negative.   Cardiovascular: Negative.   Gastrointestinal: Negative for diarrhea, nausea and vomiting.  Musculoskeletal: Negative for arthralgias.  Skin: Negative for rash.       Objective:   Physical Exam  Constitutional: She is oriented to person, place, and time. She appears well-developed and well-nourished. No distress.  HENT:  Head: Normocephalic.  Tonsils erythematous and enlarged, no exudate, enlarged cervical lymph nodes bilaterally.  Rapid strep test negative.  Bilateral TM opaque, pearly gray and shiny, mild bulging noted, no erythema or drainage.  Eyes: Pupils are equal, round, and reactive to light.  Neck: Normal range of motion.  Cardiovascular: Normal rate, regular rhythm and normal heart sounds.   Pulmonary/Chest: Effort normal and breath sounds normal. No respiratory distress. She has no wheezes.  Abdominal: Soft.  Bowel sounds are normal.  Neurological: She is alert and oriented to person, place, and time.  Skin: Skin is warm and dry.          Assessment & Plan:  The patient was diagnosed with an Acute Upper Respiratory Infection and Pharyngitis.  The patient was given an prescription for a Z-pak but instructed to use only if her symptoms did not improve in 2-3 days.  The patient was instructed to use warm saltwater gargles for her throat along with OTC Ibuprofen or Tylenol for pain.  The patient was provided patient education regarding the same.  The patient will follow up with her PCP or return if her symptoms do not improve.

## 2016-08-07 ENCOUNTER — Ambulatory Visit (INDEPENDENT_AMBULATORY_CARE_PROVIDER_SITE_OTHER): Payer: Self-pay | Admitting: Family Medicine

## 2016-08-07 VITALS — BP 100/80 | HR 76 | Temp 98.4°F | Ht 67.0 in | Wt 212.8 lb

## 2016-08-07 DIAGNOSIS — H109 Unspecified conjunctivitis: Secondary | ICD-10-CM

## 2016-08-07 MED ORDER — POLYMYXIN B-TRIMETHOPRIM 10000-0.1 UNIT/ML-% OP SOLN: 1.0000 [drp] | Freq: Four times a day (QID) | OPHTHALMIC | Status: DC

## 2016-08-07 MED ORDER — POLYMYXIN B-TRIMETHOPRIM 10000-0.1 UNIT/ML-% OP SOLN: 1.0000 [drp] | Freq: Four times a day (QID) | OPHTHALMIC | 0 refills | Status: AC

## 2016-08-07 NOTE — Progress Notes (Signed)
   Subjective:    Patient ID: Wanda Gross, female    DOB: April 29, 1965, 51 y.o.   MRN: [id number]  50 y.o female presents with rt itchy/red/crusty eye post tx for URI/Pt has not tx eye symptoms/Denies cough congestion/sore throat.  Pt takes Allegra for allergies   Conjunctivitis   The current episode started 2 days ago. The onset was gradual. The problem has been rapidly worsening. The problem is moderate. Associated symptoms include eye itching, photophobia, eye discharge, eye pain and eye redness. The eye pain is mild. The right eye is affected. The eye pain is not associated with movement. The eyelid exhibits swelling and redness. She has been behaving normally.      Review of Systems  Constitutional: Negative.   Eyes: Positive for photophobia, pain, discharge, redness and itching.  Respiratory: Negative.   Cardiovascular: Negative.   Skin: Negative.   Psychiatric/Behavioral: Negative.        Objective:   Physical Exam  Constitutional: She appears well-developed.  HENT:  Head: Normocephalic.  Mouth/Throat: Mucous membranes are normal. Posterior oropharyngeal erythema present.  Eyes: EOM are normal. Pupils are equal, round, and reactive to light. Right eye exhibits discharge. Right conjunctiva is injected. Right conjunctiva has no hemorrhage.  Cardiovascular: Normal rate.   Pulmonary/Chest: Effort normal.  Neurological: She is alert.  Skin: Skin is warm.  Psychiatric: She has a normal mood and affect.            Assessment & Plan:  Follow up as needed

## 2016-08-07 NOTE — Patient Instructions (Signed)
Demonstrated eye drop administration.

## 2016-11-28 DIAGNOSIS — Z124 Encounter for screening for malignant neoplasm of cervix: Secondary | ICD-10-CM | POA: Diagnosis not present

## 2016-11-28 DIAGNOSIS — Z1231 Encounter for screening mammogram for malignant neoplasm of breast: Secondary | ICD-10-CM | POA: Diagnosis not present

## 2016-11-28 DIAGNOSIS — Z01419 Encounter for gynecological examination (general) (routine) without abnormal findings: Secondary | ICD-10-CM | POA: Diagnosis not present

## 2016-11-28 DIAGNOSIS — Z6832 Body mass index (BMI) 32.0-32.9, adult: Secondary | ICD-10-CM | POA: Diagnosis not present

## 2016-11-28 DIAGNOSIS — E559 Vitamin D deficiency, unspecified: Secondary | ICD-10-CM | POA: Diagnosis not present

## 2016-11-30 LAB — HM PAP SMEAR: HM Pap smear: NEGATIVE

## 2017-03-01 DIAGNOSIS — E559 Vitamin D deficiency, unspecified: Secondary | ICD-10-CM | POA: Diagnosis not present

## 2017-12-21 ENCOUNTER — Ambulatory Visit: Payer: Self-pay | Admitting: Nurse Practitioner

## 2017-12-21 VITALS — BP 110/72 | HR 84 | Temp 98.7°F | Resp 20

## 2017-12-21 DIAGNOSIS — H6981 Other specified disorders of Eustachian tube, right ear: Secondary | ICD-10-CM

## 2017-12-21 DIAGNOSIS — J0111 Acute recurrent frontal sinusitis: Secondary | ICD-10-CM

## 2017-12-21 MED ORDER — FLUCONAZOLE 150 MG PO TABS: 150.0000 mg | ORAL_TABLET | Freq: Once | ORAL | 2 refills | Status: AC

## 2017-12-21 MED ORDER — FLUTICASONE PROPIONATE 50 MCG/ACT NA SUSP: 2.0000 | Freq: Every day | NASAL | 0 refills | Status: DC

## 2017-12-21 MED ORDER — DEXTROMETHORPHAN HBR 15 MG/5ML PO SYRP: 5.0000 mL | ORAL_SOLUTION | Freq: Four times a day (QID) | ORAL | 0 refills | Status: AC | PRN

## 2017-12-21 MED ORDER — AMOXICILLIN-POT CLAVULANATE 875-125 MG PO TABS: 1.0000 | ORAL_TABLET | Freq: Two times a day (BID) | ORAL | 0 refills | Status: AC

## 2017-12-21 NOTE — Patient Instructions (Addendum)
Sinusitis, Adult Sinusitis is soreness and inflammation of your sinuses. Sinuses are hollow spaces in the bones around your face. Your sinuses are located:  Around your eyes.  In the middle of your forehead.  Behind your nose.  In your cheekbones.  Your sinuses and nasal passages are lined with a stringy fluid (mucus). Mucus normally drains out of your sinuses. When your nasal tissues become inflamed or swollen, the mucus can become trapped or blocked so air cannot flow through your sinuses. This allows bacteria, viruses, and funguses to grow, which leads to infection. Sinusitis can develop quickly and last for 7?10 days (acute) or for more than 12 weeks (chronic). Sinusitis often develops after a cold. What are the causes? This condition is caused by anything that creates swelling in the sinuses or stops mucus from draining, including:  Allergies.  Asthma.  Bacterial or viral infection.  Abnormally shaped bones between the nasal passages.  Nasal growths that contain mucus (nasal polyps).  Narrow sinus openings.  Pollutants, such as chemicals or irritants in the air.  A foreign object stuck in the nose.  A fungal infection. This is rare.  What increases the risk? The following factors may make you more likely to develop this condition:  Having allergies or asthma.  Having had a recent cold or respiratory tract infection.  Having structural deformities or blockages in your nose or sinuses.  Having a weak immune system.  Doing a lot of swimming or diving.  Overusing nasal sprays.  Smoking.  What are the signs or symptoms? The main symptoms of this condition are pain and a feeling of pressure around the affected sinuses. Other symptoms include:  Upper toothache.  Earache.  Headache.  Bad breath.  Decreased sense of smell and taste.  A cough that may get worse at night.  Fatigue.  Fever.  Thick drainage from your nose. The drainage is often green and  it may contain pus (purulent).  Stuffy nose or congestion.  Postnasal drip. This is when extra mucus collects in the throat or back of the nose.  Swelling and warmth over the affected sinuses.  Sore throat.  Sensitivity to light.  How is this diagnosed? This condition is diagnosed based on symptoms, a medical history, and a physical exam. To find out if your condition is acute or chronic, your health care provider may:  Look in your nose for signs of nasal polyps.  Tap over the affected sinus to check for signs of infection.  View the inside of your sinuses using an imaging device that has a light attached (endoscope).  If your health care provider suspects that you have chronic sinusitis, you may also:  Be tested for allergies.  Have a sample of mucus taken from your nose (nasal culture) and checked for bacteria.  Have a mucus sample examined to see if your sinusitis is related to an allergy.  If your sinusitis does not respond to treatment and it lasts longer than 8 weeks, you may have an MRI or CT scan to check your sinuses. These scans also help to determine how severe your infection is. In rare cases, a bone biopsy may be done to rule out more serious types of fungal sinus disease. How is this treated? Treatment for sinusitis depends on the cause and whether your condition is chronic or acute. If a virus is causing your sinusitis, your symptoms will go away on their own within 10 days. You may be given medicines to relieve your symptoms,   including:  Topical nasal decongestants. They shrink swollen nasal passages and let mucus drain from your sinuses.  Antihistamines. These drugs block inflammation that is triggered by allergies. This can help to ease swelling in your nose and sinuses.  Topical nasal corticosteroids. These are nasal sprays that ease inflammation and swelling in your nose and sinuses.  Nasal saline washes. These rinses can help to get rid of thick mucus in  your nose.  If your condition is caused by bacteria, you will be given an antibiotic medicine. If your condition is caused by a fungus, you will be given an antifungal medicine. Surgery may be needed to correct underlying conditions, such as narrow nasal passages. Surgery may also be needed to remove polyps. Follow these instructions at home: Medicines  Take, use, or apply over-the-counter and prescription medicines only as told by your health care provider. These may include nasal sprays.   If you were prescribed an antibiotic medicine, take it as told by your health care provider. Do not stop taking the antibiotic even if you start to feel better.  Take antibiotics as directed.  Hydrate and Humidify  Drink enough water to keep your urine clear or pale yellow. Staying hydrated will help to thin your mucus.  Use a cool mist humidifier to keep the humidity level in your home above 50%.  Inhale steam for 10-15 minutes, 3-4 times a day or as told by your health care provider. You can do this in the bathroom while a hot shower is running.  Limit your exposure to cool or dry air. Rest  Rest as much as possible.  Sleep with your head raised (elevated).  Make sure to get enough sleep each night. General instructions  Apply a warm, moist washcloth to your face 3-4 times a day or as told by your health care provider. This will help with discomfort.  Wash your hands often with soap and water to reduce your exposure to viruses and other germs. If soap and water are not available, use hand sanitizer.  Do not smoke. Avoid being around people who are smoking (secondhand smoke).  Keep all follow-up visits as told by your health care provider. This is important. Contact a health care provider if:  You have a fever.  Your symptoms get worse.  Your symptoms do not improve within 10 days. Get help right away if:  You have a severe headache.  You have persistent vomiting.  You have  pain or swelling around your face or eyes.  You have vision problems.  You develop confusion.  Your neck is stiff.  You have trouble breathing. This information is not intended to replace advice given to you by your health care provider. Make sure you discuss any questions you have with your health care provider. Document Released: 12/17/2005 Document Revised: 08/12/2016 Document Reviewed: 10/12/2015 Elsevier Interactive Patient Education  2018 [employer].  Eustachian Tube Dysfunction The eustachian tube connects the middle ear to the back of the nose. It regulates air pressure in the middle ear by allowing air to move between the ear and nose. It also helps to drain fluid from the middle ear space. When the eustachian tube does not function properly, air pressure, fluid, or both can build up in the middle ear. Eustachian tube dysfunction can affect one or both ears. What are the causes? This condition happens when the eustachian tube becomes blocked or cannot open normally. This may result from:  Ear infections.  Colds and other upper respiratory  infections.  Allergies.  Irritation, such as from cigarette smoke or acid from the stomach coming up into the esophagus (gastroesophageal reflux).  Sudden changes in air pressure, such as from descending in an airplane.  Abnormal growths in the nose or throat, such as nasal polyps, tumors, or enlarged tissue at the back of the throat (adenoids).  What increases the risk? This condition may be more likely to develop in people who smoke and people who are overweight. Eustachian tube dysfunction may also be more likely to develop in children, especially children who have:  Certain birth defects of the mouth, such as cleft palate.  Large tonsils and adenoids.  What are the signs or symptoms? Symptoms of this condition may include:  A feeling of fullness in the ear.  Ear pain.  Clicking or popping noises in the ear.  Ringing in  the ear.  Hearing loss.  Loss of balance.  Symptoms may get worse when the air pressure around you changes, such as when you travel to an area of high elevation or fly on an airplane. How is this diagnosed? This condition may be diagnosed based on:  Your symptoms.  A physical exam of your ear, nose, and throat.  Tests, such as those that measure: ? The movement of your eardrum (tympanogram). ? Your hearing (audiometry).  How is this treated? Treatment depends on the cause and severity of your condition. If your symptoms are mild, you may be able to relieve your symptoms by moving air into ("popping") your ears. If you have symptoms of fluid in your ears, treatment may include:  Decongestants.  Antihistamines.  Nasal sprays or ear drops that contain medicines that reduce swelling (steroids).  Take Benadryl 25mg  at bedtime until symptoms improve.   In some cases, you may need to have a procedure to drain the fluid in your eardrum (myringotomy). In this procedure, a small tube is placed in the eardrum to:  Drain the fluid.  Restore the air in the middle ear space.  Follow these instructions at home:  Take over-the-counter and prescription medicines only as told by your health care provider.  Use techniques to help pop your ears as recommended by your health care provider. These may include: ? Chewing gum. ? Yawning. ? Frequent, forceful swallowing. ? Closing your mouth, holding your nose closed, and gently blowing as if you are trying to blow air out of your nose.  Do not do any of the following until your health care provider approves: ? Travel to high altitudes. ? Fly in airplanes. ? Work in a Pension scheme manager or room. ? Scuba dive.  Keep your ears dry. Dry your ears completely after showering or bathing.  Do not smoke.  Keep all follow-up visits as told by your health care provider. This is important. Contact a health care provider if:  Your symptoms do not  go away after treatment.  Your symptoms come back after treatment.  You are unable to pop your ears.  You have: ? A fever. ? Pain in your ear. ? Pain in your head or neck. ? Fluid draining from your ear.  Your hearing suddenly changes.  You become very dizzy.  You lose your balance. This information is not intended to replace advice given to you by your health care provider. Make sure you discuss any questions you have with your health care provider. Document Released: 01/13/2016 Document Revised: 05/24/2016 Document Reviewed: 01/05/2015 Elsevier Interactive Patient Education  [employer].

## 2017-12-21 NOTE — Progress Notes (Signed)
Subjective:  Wanda Gross is a 52 y.o. female who presents for evaluation of possible sinusitis.  Symptoms include right ear pressure/pain, facial pain, nasal congestion, post nasal drip, productive cough with green colored sputum, sinus pressure, sinus pain, sneezing and tooth pain.  Onset of symptoms was 3 days ago, and has been rapidly worsening since that time.  Treatment to date:  none.  High risk factors for influenza complications:  none.  The following portions of the patient's history were reviewed and updated as appropriate:  allergies, current medications and past medical history.  Constitutional: positive for fatigue Eyes: negative Ears, nose, mouth, throat, and face: positive for earaches, nasal congestion and post nasal drip, negative for ear drainage and sore throat Respiratory: positive for cough Cardiovascular: negative Neurological: positive for headaches Behavioral/Psych: negative Objective:  BP 110/72 (BP Location: Right Arm, Patient Position: Sitting, Cuff Size: Normal)   Pulse 84   Temp 98.7 F (37.1 C) (Oral)   Resp 20   SpO2 98%  General appearance: alert, cooperative and fatigued Head: Normocephalic, without obvious abnormality, atraumatic Eyes: conjunctivae/corneas clear. PERRL, EOM's intact. Fundi benign. Ears: abnormal TM right ear - moderate effusion Nose: no discharge, right turbinate edematous, inflamed, moderate maxillary sinus tenderness right, mild frontal sinus tenderness bilateral Throat: lips, mucosa, and tongue normal; teeth and gums normal Lungs: clear to auscultation bilaterally Heart: regular rate and rhythm, S1, S2 normal, no murmur, click, rub or gallop Pulses: 2+ and symmetric Skin: Skin color, texture, turgor normal. No rashes or lesions Lymph nodes: Cervical, supraclavicular, and axillary nodes normal. and Cervical and submandibular nodes normal Neurologic: Grossly normal    Assessment:  sinusitis and Right Eustachian Tube  Dysfunction    Plan:  Discussed diagnosis and treatment of sinusitis. Educational material distributed and questions answered. Suggested symptomatic OTC remedies. Supportive care with appropriate antipyretics and fluids. Nasal saline spray for congestion. Augmentin per orders. Nasal steroids per orders. Follow up as needed. Patient will use Benadryl 25mg  at bedtime until symptoms improve.  Patient also requested Diflucan for yeast infections during abx treatment.

## 2017-12-23 ENCOUNTER — Telehealth: Payer: Self-pay | Admitting: Emergency Medicine

## 2018-02-07 ENCOUNTER — Ambulatory Visit: Payer: Self-pay | Admitting: Adult Health

## 2018-02-07 ENCOUNTER — Encounter: Payer: Self-pay | Admitting: Adult Health

## 2018-02-07 VITALS — BP 128/70 | HR 70 | Temp 98.1°F | Resp 16 | Ht 67.0 in | Wt 227.0 lb

## 2018-02-07 DIAGNOSIS — J4521 Mild intermittent asthma with (acute) exacerbation: Secondary | ICD-10-CM

## 2018-02-07 DIAGNOSIS — E559 Vitamin D deficiency, unspecified: Secondary | ICD-10-CM | POA: Insufficient documentation

## 2018-02-07 DIAGNOSIS — E669 Obesity, unspecified: Secondary | ICD-10-CM | POA: Insufficient documentation

## 2018-02-07 DIAGNOSIS — J4520 Mild intermittent asthma, uncomplicated: Secondary | ICD-10-CM | POA: Insufficient documentation

## 2018-02-07 DIAGNOSIS — J019 Acute sinusitis, unspecified: Secondary | ICD-10-CM

## 2018-02-07 MED ORDER — PREDNISONE 10 MG (21) PO TBPK: ORAL_TABLET | ORAL | 0 refills | Status: DC

## 2018-02-07 MED ORDER — TERCONAZOLE 0.8 % VA CREA: 1.0000 | TOPICAL_CREAM | Freq: Every day | VAGINAL | 0 refills | Status: DC

## 2018-02-07 MED ORDER — AMOXICILLIN-POT CLAVULANATE 875-125 MG PO TABS: 1.0000 | ORAL_TABLET | Freq: Two times a day (BID) | ORAL | 0 refills | Status: DC

## 2018-02-07 NOTE — Patient Instructions (Signed)
Cough, Adult A cough helps to clear your throat and lungs. A cough may last only 2-3 weeks (acute), or it may last longer than 8 weeks (chronic). Many different things can cause a cough. A cough may be a sign of an illness or another medical condition. Follow these instructions at home:  Pay attention to any changes in your cough.  Take medicines only as told by your doctor. ? If you were prescribed an antibiotic medicine, take it as told by your doctor. Do not stop taking it even if you start to feel better. ? Talk with your doctor before you try using a cough medicine.  Drink enough fluid to keep your pee (urine) clear or pale yellow.  If the air is dry, use a cold steam vaporizer or humidifier in your home.  Stay away from things that make you cough at work or at home.  If your cough is worse at night, try using extra pillows to raise your head up higher while you sleep.  Do not smoke, and try not to be around smoke. If you need help quitting, ask your doctor.  Do not have caffeine.  Do not drink alcohol.  Rest as needed. Contact a doctor if:  You have new problems (symptoms).  You cough up yellow fluid (pus).  Your cough does not get better after 2-3 weeks, or your cough gets worse.  Medicine does not help your cough and you are not sleeping well.  You have pain that gets worse or pain that is not helped with medicine.  You have a fever.  You are losing weight and you do not know why.  You have night sweats. Get help right away if:  You cough up blood.  You have trouble breathing.  Your heartbeat is very fast. This information is not intended to replace advice given to you by your health care provider. Make sure you discuss any questions you have with your health care provider. Document Released: 08/30/2011 Document Revised: 05/24/2016 Document Reviewed: 02/23/2015 Elsevier Interactive Patient Education  2018 [employer]. Prednisone tablets What is this  medicine? PREDNISONE (PRED ni sone) is a corticosteroid. It is commonly used to treat inflammation of the skin, joints, lungs, and other organs. Common conditions treated include asthma, allergies, and arthritis. It is also used for other conditions, such as blood disorders and diseases of the adrenal glands. This medicine may be used for other purposes; ask your health care provider or pharmacist if you have questions. COMMON BRAND NAME(S): Deltasone, Predone, Sterapred, Sterapred DS What should I tell my health care provider before I take this medicine? They need to know if you have any of these conditions: -Cushing's syndrome -diabetes -glaucoma -heart disease -high blood pressure -infection (especially a virus infection such as chickenpox, cold sores, or herpes) -kidney disease -liver disease -mental illness -myasthenia gravis -osteoporosis -seizures -stomach or intestine problems -thyroid disease -an unusual or allergic reaction to lactose, prednisone, other medicines, foods, dyes, or preservatives -pregnant or trying to get pregnant -breast-feeding How should I use this medicine? Take this medicine by mouth with a glass of water. Follow the directions on the prescription label. Take this medicine with food. If you are taking this medicine once a day, take it in the morning. Do not take more medicine than you are told to take. Do not suddenly stop taking your medicine because you may develop a severe reaction. Your doctor will tell you how much medicine to take. If your doctor wants you  to stop the medicine, the dose may be slowly lowered over time to avoid any side effects. Talk to your pediatrician regarding the use of this medicine in children. Special care may be needed. Overdosage: If you think you have taken too much of this medicine contact a poison control center or emergency room at once. NOTE: This medicine is only for you. Do not share this medicine with others. What if I  miss a dose? If you miss a dose, take it as soon as you can. If it is almost time for your next dose, talk to your doctor or health care professional. You may need to miss a dose or take an extra dose. Do not take double or extra doses without advice. What may interact with this medicine? Do not take this medicine with any of the following medications: -metyrapone -mifepristone This medicine may also interact with the following medications: -aminoglutethimide -amphotericin B -aspirin and aspirin-like medicines -barbiturates -certain medicines for diabetes, like glipizide or glyburide -cholestyramine -cholinesterase inhibitors -cyclosporine -digoxin -diuretics -ephedrine -female hormones, like estrogens and birth control pills -isoniazid -ketoconazole -NSAIDS, medicines for pain and inflammation, like ibuprofen or naproxen -phenytoin -rifampin -toxoids -vaccines -warfarin This list may not describe all possible interactions. Give your health care provider a list of all the medicines, herbs, non-prescription drugs, or dietary supplements you use. Also tell them if you smoke, drink alcohol, or use illegal drugs. Some items may interact with your medicine. What should I watch for while using this medicine? Visit your doctor or health care professional for regular checks on your progress. If you are taking this medicine over a prolonged period, carry an identification card with your name and address, the type and dose of your medicine, and your doctor's name and address. This medicine may increase your risk of getting an infection. Tell your doctor or health care professional if you are around anyone with measles or chickenpox, or if you develop sores or blisters that do not heal properly. If you are going to have surgery, tell your doctor or health care professional that you have taken this medicine within the last twelve months. Ask your doctor or health care professional about your  diet. You may need to lower the amount of salt you eat. This medicine may affect blood sugar levels. If you have diabetes, check with your doctor or health care professional before you change your diet or the dose of your diabetic medicine. What side effects may I notice from receiving this medicine? Side effects that you should report to your doctor or health care professional as soon as possible: -allergic reactions like skin rash, itching or hives, swelling of the face, lips, or tongue -changes in emotions or moods -changes in vision -depressed mood -eye pain -fever or chills, cough, sore throat, pain or difficulty passing urine -increased thirst -swelling of ankles, feet Side effects that usually do not require medical attention (report to your doctor or health care professional if they continue or are bothersome): -confusion, excitement, restlessness -headache -nausea, vomiting -skin problems, acne, thin and shiny skin -trouble sleeping -weight gain This list may not describe all possible side effects. Call your doctor for medical advice about side effects. You may report side effects to FDA at 1-800-FDA-1088. Where should I keep my medicine? Keep out of the reach of children. Store at room temperature between 15 and 30 degrees C (59 and 86 degrees F). Protect from light. Keep container tightly closed. Throw away any unused medicine after the expiration  date. NOTE: This sheet is a summary. It may not cover all possible information. If you have questions about this medicine, talk to your doctor, pharmacist, or health care provider.  2018 Elsevier/Gold Standard (2011-08-02 10:57:14) Sinusitis, Adult Sinusitis is soreness and inflammation of your sinuses. Sinuses are hollow spaces in the bones around your face. They are located:  Around your eyes.  In the middle of your forehead.  Behind your nose.  In your cheekbones.  Your sinuses and nasal passages are lined with a stringy  fluid (mucus). Mucus normally drains out of your sinuses. When your nasal tissues get inflamed or swollen, the mucus can get trapped or blocked so air cannot flow through your sinuses. This lets bacteria, viruses, and funguses grow, and that leads to infection. Follow these instructions at home: Medicines  Take, use, or apply over-the-counter and prescription medicines only as told by your doctor. These may include nasal sprays.  If you were prescribed an antibiotic medicine, take it as told by your doctor. Do not stop taking the antibiotic even if you start to feel better. Hydrate and Humidify  Drink enough water to keep your pee (urine) clear or pale yellow.  Use a cool mist humidifier to keep the humidity level in your home above 50%.  Breathe in steam for 10-15 minutes, 3-4 times a day or as told by your doctor. You can do this in the bathroom while a hot shower is running.  Try not to spend time in cool or dry air. Rest  Rest as much as possible.  Sleep with your head raised (elevated).  Make sure to get enough sleep each night. General instructions  Put a warm, moist washcloth on your face 3-4 times a day or as told by your doctor. This will help with discomfort.  Wash your hands often with soap and water. If there is no soap and water, use hand sanitizer.  Do not smoke. Avoid being around people who are smoking (secondhand smoke).  Keep all follow-up visits as told by your doctor. This is important. Contact a doctor if:  You have a fever.  Your symptoms get worse.  Your symptoms do not get better within 10 days. Get help right away if:  You have a very bad headache.  You cannot stop throwing up (vomiting).  You have pain or swelling around your face or eyes.  You have trouble seeing.  You feel confused.  Your neck is stiff.  You have trouble breathing. This information is not intended to replace advice given to you by your health care provider. Make sure  you discuss any questions you have with your health care provider. Document Released: 06/04/2008 Document Revised: 08/12/2016 Document Reviewed: 10/12/2015 Elsevier Interactive Patient Education  [employer].

## 2018-02-07 NOTE — Progress Notes (Signed)
Subjective:     Patient ID: Wanda Gross, female   DOB: 12-21-1965, 53 y.o.   MRN: [id number]  HPI  Patient as a 53 year old female in no acute distress who comes to the clinic with complaints of ear pain. Bilateral ears but right than left ear, post nasal drip cough, sore throat.  She is  taking Mucinex. She reports symptoms for around three weeks.    Patient  denies any fever, chills, rash, chest pain, shortness of breath, nausea, vomiting, or diarrhea. Denies any edema.   She reports she has seen Dr. Everett Graff and had CMP and CBC reports all was normal - Denies kidney/ liver function are normal.   She reports some sick in her office.  Blood pressure 128/70, pulse 70, temperature 98.1 F (36.7 C), resp. rate 16, height 5\' 7"  (1.702 m), weight 227 lb (103 kg), SpO2 99 %.  No Known Allergies     Current Outpatient Medications:  .  albuterol (PROVENTIL HFA;VENTOLIN HFA) 108 (90 BASE) MCG/ACT inhaler, Inhale 2 puffs into the lungs every 6 (six) hours as needed for wheezing or shortness of breath., Disp: 1 Inhaler, Rfl: 2 .  Chlorpheniramine-Phenylephrine (SINUS & ALLERGY PO), Take 1 tablet by mouth at bedtime as needed (for congestion)., Disp: , Rfl:  .  antipyrine-benzocaine (AURALGAN) otic solution, Place 3-4 drops into the right ear every 2 (two) hours as needed for ear pain. (Patient not taking: Reported on 12/21/2017), Disp: 10 mL, Rfl: 0 .  Cobalamine Combinations (B-12) [phone] MCG SUBL, B12  1 q day, Disp: , Rfl:  .  fluticasone (FLONASE) 50 MCG/ACT nasal spray, Place 2 sprays into both nostrils daily for 10 days., Disp: 16 g, Rfl: 0 .  ibuprofen (ADVIL,MOTRIN) 600 MG tablet, ibuprofen 600 mg tablet, Disp: , Rfl:  .  ipratropium (ATROVENT) 0.06 % nasal spray, ipratropium bromide 42 mcg (0.06 %) nasal spray, Disp: , Rfl:    Review of Systems  Constitutional: Positive for fatigue (mild ). Negative for activity change, appetite change, chills, diaphoresis, fever and  unexpected weight change.  HENT: Positive for ear pain, postnasal drip, rhinorrhea, sinus pressure and sore throat. Negative for congestion, dental problem, drooling, ear discharge, facial swelling, hearing loss, mouth sores, nosebleeds, sinus pain, sneezing, tinnitus, trouble swallowing and voice change.   Respiratory: Negative.   Cardiovascular: Negative.   Gastrointestinal: Negative.   Endocrine: Negative.   Genitourinary: Negative.   Musculoskeletal: Negative.   Skin: Negative.   Neurological: Negative.   Hematological: Negative.   Psychiatric/Behavioral: Negative.        Objective:   Physical Exam  Constitutional: She is oriented to person, place, and time. Vital signs are normal. She appears well-developed and well-nourished. She is active.  Non-toxic appearance. She does not have a sickly appearance. She does not appear ill. No distress.  HENT:  Head: Normocephalic and atraumatic.  Right Ear: Ear canal normal. No lacerations. No drainage, swelling or tenderness. No foreign bodies. No mastoid tenderness. Tympanic membrane is not injected, not scarred, not perforated, not erythematous, not retracted and not bulging. Tympanic membrane mobility is normal. A middle ear effusion is present. No hemotympanum. No decreased hearing is noted.  Left Ear: Hearing, external ear and ear canal normal. No lacerations. No drainage, swelling or tenderness. No foreign bodies. No mastoid tenderness. Tympanic membrane is not injected, not scarred, not perforated, not erythematous, not retracted and not bulging. Tympanic membrane mobility is normal. A middle ear effusion is present. No hemotympanum. No decreased  hearing is noted.  Nose: Mucosal edema and rhinorrhea present. Right sinus exhibits maxillary sinus tenderness. Right sinus exhibits no frontal sinus tenderness. Left sinus exhibits maxillary sinus tenderness. Left sinus exhibits no frontal sinus tenderness.  Mouth/Throat: Uvula is midline and mucous  membranes are normal. No uvula swelling. Posterior oropharyngeal erythema present. No oropharyngeal exudate, posterior oropharyngeal edema or tonsillar abscesses.  Eyes: Conjunctivae, EOM and lids are normal. Pupils are equal, round, and reactive to light. Right eye exhibits no discharge. Left eye exhibits no discharge. No scleral icterus.  Neck: Trachea normal, normal range of motion, full passive range of motion without pain and phonation normal. Neck supple. No JVD present. No tracheal deviation present. No Brudzinski's sign noted.  Cardiovascular: Normal rate, regular rhythm, normal heart sounds and intact distal pulses. Exam reveals no gallop and no friction rub.  No murmur heard. Pulmonary/Chest: Effort normal and breath sounds normal. No stridor. No respiratory distress. She has no wheezes. She has no rales. She exhibits no tenderness.  Abdominal: Soft. Bowel sounds are normal. She exhibits no distension and no mass. There is no tenderness. There is no rebound and no guarding.  Musculoskeletal: Normal range of motion.  Patient moves on and off of exam table and in room without difficulty. Gait is normal in hall and in room. Patient is oriented to person place time and situation. Patient answers questions appropriately and engages in conversation.   Lymphadenopathy:       Head (right side): Submandibular adenopathy present. No submental, no tonsillar, no preauricular, no posterior auricular and no occipital adenopathy present.       Head (left side): Submandibular (mild bilaterally ) adenopathy present. No submental, no tonsillar, no preauricular, no posterior auricular and no occipital adenopathy present.    She has no cervical adenopathy.       Right cervical: No superficial cervical, no deep cervical and no posterior cervical adenopathy present.      Left cervical: No superficial cervical, no deep cervical and no posterior cervical adenopathy present.  Neurological: She is alert and oriented  to person, place, and time. She displays normal reflexes. No cranial nerve deficit. She exhibits normal muscle tone. Coordination normal.  Skin: Skin is warm, dry and intact. No rash noted. She is not diaphoretic. No erythema. No pallor.  Psychiatric: She has a normal mood and affect. Her behavior is normal. Judgment and thought content normal.  Nursing note and vitals reviewed.      Assessment:     Acute non-recurrent sinusitis, unspecified location     Plan:         Meds ordered this encounter  Medications  . amoxicillin-clavulanate (AUGMENTIN) 875-125 MG tablet    Sig: Take 1 tablet by mouth 2 (two) times daily.    Dispense:  20 tablet    Refill:  0  . predniSONE (STERAPRED UNI-PAK 21 TAB) 10 MG (21) TBPK tablet    Sig: By mouth Take 6 tablets on day 1, Take 5 tablets day 2 Take 4 tablets day 3 Take 3 tablets day 4 Take 2 tablets day five 5 Take 1 tablet day    Dispense:  21 tablet    Refill:  0  . terconazole (TERAZOL 3) 0.8 % vaginal cream    Sig: Place 1 applicator vaginally at bedtime.    Dispense:  20 g    Refill:  0       She requests, Terazol for yeast she usually gets with antibiotics.   Advised to return  to clinic for an appointment if no improvement within 72 hours or if any symptoms change or worsen. Advised ER or urgent Care if after hours or on weekend. 911 for emergency symptoms at any time.   Patient verbalized understanding of all instructions given and denies any further questions at this time.

## 2018-03-03 ENCOUNTER — Ambulatory Visit: Payer: BLUE CROSS/BLUE SHIELD | Admitting: Family Medicine

## 2018-03-21 ENCOUNTER — Encounter: Payer: Self-pay | Admitting: Family Medicine

## 2018-03-21 ENCOUNTER — Other Ambulatory Visit: Payer: Self-pay

## 2018-03-21 ENCOUNTER — Ambulatory Visit: Payer: BLUE CROSS/BLUE SHIELD | Admitting: Family Medicine

## 2018-03-21 VITALS — BP 128/82 | HR 59 | Temp 98.1°F | Ht 67.0 in | Wt 229.4 lb

## 2018-03-21 DIAGNOSIS — F5104 Psychophysiologic insomnia: Secondary | ICD-10-CM

## 2018-03-21 DIAGNOSIS — J4521 Mild intermittent asthma with (acute) exacerbation: Secondary | ICD-10-CM | POA: Diagnosis not present

## 2018-03-21 DIAGNOSIS — N951 Menopausal and female climacteric states: Secondary | ICD-10-CM

## 2018-03-21 DIAGNOSIS — N946 Dysmenorrhea, unspecified: Secondary | ICD-10-CM | POA: Insufficient documentation

## 2018-03-21 DIAGNOSIS — J301 Allergic rhinitis due to pollen: Secondary | ICD-10-CM | POA: Insufficient documentation

## 2018-03-21 MED ORDER — ALBUTEROL SULFATE HFA 108 (90 BASE) MCG/ACT IN AERS: 2.0000 | INHALATION_SPRAY | Freq: Four times a day (QID) | RESPIRATORY_TRACT | 2 refills | Status: DC | PRN

## 2018-03-21 MED ORDER — ALBUTEROL SULFATE HFA 108 (90 BASE) MCG/ACT IN AERS
2.0000 | INHALATION_SPRAY | Freq: Four times a day (QID) | RESPIRATORY_TRACT | 2 refills | Status: DC | PRN
Start: 2018-03-21 — End: 2020-03-03

## 2018-03-21 MED ORDER — BECLOMETHASONE DIPROP HFA 80 MCG/ACT IN AERB: 1.0000 | INHALATION_SPRAY | Freq: Two times a day (BID) | RESPIRATORY_TRACT | 3 refills | Status: DC

## 2018-03-21 NOTE — Patient Instructions (Signed)
Please return in 6 weeks for your annual complete physical; please come fasting.   It was a pleasure meeting you today! Thank you for choosing [place] to meet your healthcare needs! I truly look forward to working with you. If you have any questions or concerns, please send me a message via Mychart or call the office at [phone].  Black cohosh and valerian root can be used to help hot flashes and sleep  Insomnia Insomnia is a sleep disorder that makes it difficult to fall asleep or to stay asleep. Insomnia can cause tiredness (fatigue), low energy, difficulty concentrating, mood swings, and poor performance at work or school. There are three different ways to classify insomnia:  Difficulty falling asleep.  Difficulty staying asleep.  Waking up too early in the morning.  Any type of insomnia can be long-term (chronic) or short-term (acute). Both are common. Short-term insomnia usually lasts for three months or less. Chronic insomnia occurs at least three times a week for longer than three months. What are the causes? Insomnia may be caused by another condition, situation, or substance, such as:  Anxiety.  Certain medicines.  Gastroesophageal reflux disease (GERD) or other gastrointestinal conditions.  Asthma or other breathing conditions.  Restless legs syndrome, sleep apnea, or other sleep disorders.  Chronic pain.  Menopause. This may include hot flashes.  Stroke.  Abuse of alcohol, tobacco, or illegal drugs.  Depression.  Caffeine.  Neurological disorders, such as Alzheimer disease.  An overactive thyroid (hyperthyroidism).  The cause of insomnia may not be known. What increases the risk? Risk factors for insomnia include:  Gender. Women are more commonly affected than men.  Age. Insomnia is more common as you get older.  Stress. This may involve your professional or personal life.  Income. Insomnia is more common in people with lower income.  Lack of  exercise.  Irregular work schedule or night shifts.  Traveling between different time zones.  What are the signs or symptoms? If you have insomnia, trouble falling asleep or trouble staying asleep is the main symptom. This may lead to other symptoms, such as:  Feeling fatigued.  Feeling nervous about going to sleep.  Not feeling rested in the morning.  Having trouble concentrating.  Feeling irritable, anxious, or depressed.  How is this treated? Treatment for insomnia depends on the cause. If your insomnia is caused by an underlying condition, treatment will focus on addressing the condition. Treatment may also include:  Medicines to help you sleep.  Counseling or therapy.  Lifestyle adjustments.  Follow these instructions at home:  Take medicines only as directed by your health care provider.  Keep regular sleeping and waking hours. Avoid naps.  Keep a sleep diary to help you and your health care provider figure out what could be causing your insomnia. Include: ? When you sleep. ? When you wake up during the night. ? How well you sleep. ? How rested you feel the next day. ? Any side effects of medicines you are taking. ? What you eat and drink.  Make your bedroom a comfortable place where it is easy to fall asleep: ? Put up shades or special blackout curtains to block light from outside. ? Use a white noise machine to block noise. ? Keep the temperature cool.  Exercise regularly as directed by your health care provider. Avoid exercising right before bedtime.  Use relaxation techniques to manage stress. Ask your health care provider to suggest some techniques that may work well for you. These  may include: ? Breathing exercises. ? Routines to release muscle tension. ? Visualizing peaceful scenes.  Cut back on alcohol, caffeinated beverages, and cigarettes, especially close to bedtime. These can disrupt your sleep.  Do not overeat or eat spicy foods right before  bedtime. This can lead to digestive discomfort that can make it hard for you to sleep.  Limit screen use before bedtime. This includes: ? Watching TV. ? Using your smartphone, tablet, and computer.  Stick to a routine. This can help you fall asleep faster. Try to do a quiet activity, brush your teeth, and go to bed at the same time each night.  Get out of bed if you are still awake after 15 minutes of trying to sleep. Keep the lights down, but try reading or doing a quiet activity. When you feel sleepy, go back to bed.  Make sure that you drive carefully. Avoid driving if you feel very sleepy.  Keep all follow-up appointments as directed by your health care provider. This is important. Contact a health care provider if:  You are tired throughout the day or have trouble in your daily routine due to sleepiness.  You continue to have sleep problems or your sleep problems get worse. Get help right away if:  You have serious thoughts about hurting yourself or someone else. This information is not intended to replace advice given to you by your health care provider. Make sure you discuss any questions you have with your health care provider. Document Released: 12/14/2000 Document Revised: 05/18/2016 Document Reviewed: 09/17/2014 Elsevier Interactive Patient Education  [employer].

## 2018-03-21 NOTE — Progress Notes (Signed)
Subjective  CC:  Chief Complaint  Patient presents with  . Establish Care  . Insomnia    Patient states she can go to sleep, but has problems staying asleep   . Medication Refill    Qvar & Albuterol     HPI: Wanda Gross is a 53 y.o. female who presents to [facility] today to establish care with me as a new patient. She sees Dr. Mancel Bale for GYN.  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  She is been there for 6 years and overall enjoys her job although it is stressful.  She is married to a professor at a and [employer].  They have 1 daughter who is a Paramedic at [employer].  Overall her health is good.  She does have mild intermittent asthma.  It is triggered mainly during seasonal allergy season.  She has seen Dr. Ishmael Holter in the past who prescribed as needed Qvar and albuterol.  She rarely uses her medications but probably will start due to the upcoming allergy season.  She also takes Zyrtec.  Most recently having problems with sleep.  She reports about a 2-4-year history of problems with sleep, but they are worsening over the last 6 months or so.  We reviewed her sleep regimen in detail.  Typically goes to bed around 1030 and is able to fall asleep but wakes up several hours later.  She admits she is usually thinking about work related duties and responsibilities at that time.  Ha she d to fall back asleep.  She has tried melatonin without relief.  She typically gets out of the bed and will do things like exercise or watch TV.  She admits to intermittent hot flashes that interfere with sleep.  She denies symptoms of sleep apnea.  Typically she would need about 8 hours of sleep however she typically sleeps about 6-8 hours nightly.  She is no longer exercising due to having a busy day.  She denies mood problems.  She  does have stress from work.  She has not tried any other sleep medications or treatments.  We updated and reviewed the patient's past history in detail and it is documented below.  Patient Active Problem List   Diagnosis Date Noted  . Dysmenorrhea 03/21/2018  . Seasonal allergic rhinitis due to pollen 03/21/2018  . Obesity with body mass index 30 or greater 02/07/2018  . Vitamin D deficiency 02/07/2018  . Mild intermittent acute asthmatic bronchitis 02/07/2018   Health Maintenance  Topic Date Due  . Hepatitis C Screening  [birth year]  . HIV Screening  08/11/1979  . MAMMOGRAM  04/19/2018  . PAP SMEAR  04/19/2020  . TETANUS/TDAP  08/01/2023  . COLONOSCOPY  11/19/2024  . INFLUENZA VACCINE  Completed   Immunization History  Administered Date(s) Administered  . Influenza-Unspecified 10/22/2017   Current Meds  Medication Sig  . albuterol (PROVENTIL HFA;VENTOLIN HFA) 108 (90 Base) MCG/ACT inhaler Inhale 2 puffs into the lungs every 6 (six) hours as needed for wheezing or shortness of breath.  . Cholecalciferol (VITAMIN D3) 1000 units CAPS Vitamin D3  . Cobalamine Combinations (B-12) [phone] MCG SUBL B12  1 q day  . [DISCONTINUED] albuterol (PROVENTIL HFA;VENTOLIN HFA) 108 (90 BASE) MCG/ACT inhaler Inhale 2 puffs into the lungs every 6 (six) hours as needed for wheezing or shortness of breath.  . [  DISCONTINUED] albuterol (PROVENTIL HFA;VENTOLIN HFA) 108 (90 Base) MCG/ACT inhaler Inhale 2 puffs into the lungs every 6 (six) hours as needed for wheezing or shortness of breath.  . [DISCONTINUED] beclomethasone (QVAR) 80 MCG/ACT inhaler Qvar 80 mcg/actuation Metered Aerosol oral inhaler    Allergies: Patient has No Known Allergies. Past Medical History Patient  has a past medical history of Allergy, Asthma, Fibroid, and Medical history non-contributory. Past Surgical History Patient  has a past surgical history that includes Wisdom tooth extraction and Dilatation & currettage/hysteroscopy  with novasure ablation (N/A, 07/24/2013). Family History: Patient family history includes Alcohol abuse in her paternal grandfather; Breast cancer in her maternal aunt and paternal aunt; COPD in her mother; Congestive Heart Failure in her father; Healthy in her brother and daughter; Hypertension in her father and mother; Hypothyroidism in her father and sister; Lung cancer in her maternal grandmother; Ovarian cancer in her paternal grandmother. Social History:  Patient  reports that she has never smoked. She has never used smokeless tobacco. She reports that she drinks alcohol. She reports that she does not use drugs.  Review of Systems: Constitutional: negative for fever or malaise Ophthalmic: negative for photophobia, double vision or loss of vision Cardiovascular: negative for chest pain, dyspnea on exertion, or new LE swelling Respiratory: negative for SOB or persistent cough Gastrointestinal: negative for abdominal pain, change in bowel habits or melena Genitourinary: negative for dysuria or gross hematuria Musculoskeletal: negative for new gait disturbance or muscular weakness Integumentary: negative for new or persistent rashes Neurological: negative for TIA or stroke symptoms Psychiatric: negative for SI or delusions Allergic/Immunologic: negative for hives  Patient Care Team    Relationship Specialty Notifications Start End  Leamon Arnt, MD PCP - General Family Medicine  03/21/18   Everett Graff, MD Consulting Physician Obstetrics and Gynecology  03/21/18     Objective  Vitals: BP 128/82   Pulse (!) 59   Temp 98.1 F (36.7 C)   Ht 5\' 7"  (1.702 m)   Wt 229 lb 6.4 oz (104.1 kg)   BMI 35.93 kg/m  General:  Well developed, well nourished, no acute distress  Psych:  Alert and oriented,normal mood and affect Neurologic:    Mental status is normal. Normal gait  Assessment  1. Psychophysiological insomnia   2. Seasonal allergic rhinitis due to pollen   3. Perimenopausal     4. Mild intermittent acute asthmatic bronchitis      Plan   Today's visit was 45 minutes long. Greater than 50% of this time was devoted to face to face counseling with the patient and coordination of care. We discussed her diagnosis, prognosis, treatment options and the treatment plan is documented below.   Insomnia, multifactorial but mostly psychophysiological.  Discussed sleep hygiene and made recommendations of trial of black cohosh to treat perimenopausal hot flashes, ovarian root, ending her work day at a reasonable hour and having a winding down period.  Recommend starting exercise again and getting outside for vitamin D exposure and to decrease stress levels.  We will follow-up at next visit and consider sleep medications if needed.  Asthma is well controlled refilled medications  Due for health maintenance visit, next visit for complete physical with lab work, 1-3 months  Commons side effects, risks, benefits, and alternatives for medications and treatment plan prescribed today were discussed, and the patient expressed understanding of the given instructions. Patient is instructed to call or message via MyChart if he/she has any questions or concerns regarding our treatment plan.  No barriers to understanding were identified. We discussed Red Flag symptoms and signs in detail. Patient expressed understanding regarding what to do in case of urgent or emergency type symptoms.   Medication list was reconciled, printed and provided to the patient in AVS. Patient instructions and summary information was reviewed with the patient as documented in the AVS. This note was prepared with assistance of Dragon voice recognition software. Occasional wrong-word or sound-a-like substitutions may have occurred due to the inherent limitations of voice recognition software  No orders of the defined types were placed in this encounter.  Meds ordered this encounter  Medications  . beclomethasone (QVAR  REDIHALER) 80 MCG/ACT inhaler    Sig: Inhale 1 puff into the lungs 2 (two) times daily.    Dispense:  10.6 g    Refill:  3  . DISCONTD: albuterol (PROVENTIL HFA;VENTOLIN HFA) 108 (90 Base) MCG/ACT inhaler    Sig: Inhale 2 puffs into the lungs every 6 (six) hours as needed for wheezing or shortness of breath.    Dispense:  1 Inhaler    Refill:  2  . albuterol (PROVENTIL HFA;VENTOLIN HFA) 108 (90 Base) MCG/ACT inhaler    Sig: Inhale 2 puffs into the lungs every 6 (six) hours as needed for wheezing or shortness of breath.    Dispense:  1 Inhaler    Refill:  2

## 2018-05-09 ENCOUNTER — Other Ambulatory Visit (INDEPENDENT_AMBULATORY_CARE_PROVIDER_SITE_OTHER): Payer: BLUE CROSS/BLUE SHIELD

## 2018-05-09 ENCOUNTER — Ambulatory Visit: Payer: BLUE CROSS/BLUE SHIELD

## 2018-05-09 ENCOUNTER — Encounter: Payer: Self-pay | Admitting: Family Medicine

## 2018-05-09 ENCOUNTER — Other Ambulatory Visit: Payer: Self-pay

## 2018-05-09 ENCOUNTER — Ambulatory Visit (INDEPENDENT_AMBULATORY_CARE_PROVIDER_SITE_OTHER): Payer: BLUE CROSS/BLUE SHIELD | Admitting: Family Medicine

## 2018-05-09 VITALS — BP 108/74 | HR 66 | Temp 98.1°F | Ht 67.0 in | Wt 228.2 lb

## 2018-05-09 DIAGNOSIS — Z0000 Encounter for general adult medical examination without abnormal findings: Secondary | ICD-10-CM

## 2018-05-09 LAB — LIPID PANEL
CHOL/HDL RATIO: 2
Cholesterol: 147 mg/dL (ref 0–200)
HDL: 58.8 mg/dL (ref >39.00)
LDL Cholesterol: 74 mg/dL (ref 0–99)
NonHDL: 88.14
TRIGLYCERIDES: 73 mg/dL (ref 0.0–149.0)
VLDL: 14.6 mg/dL (ref 0.0–40.0)

## 2018-05-09 LAB — CBC WITH DIFFERENTIAL/PLATELET
Basophils Absolute: 0 10*3/uL (ref 0.0–0.1)
Basophils Relative: 0.6 % (ref 0.0–3.0)
EOS PCT: 2 % (ref 0.0–5.0)
Eosinophils Absolute: 0.1 10*3/uL (ref 0.0–0.7)
HCT: 38 % (ref 36.0–46.0)
HEMOGLOBIN: 12.6 g/dL (ref 12.0–15.0)
Lymphocytes Relative: 36.6 % (ref 12.0–46.0)
Lymphs Abs: 1.7 10*3/uL (ref 0.7–4.0)
MCHC: 33.2 g/dL (ref 30.0–36.0)
MCV: 84.3 fl (ref 78.0–100.0)
MONOS PCT: 9.8 % (ref 3.0–12.0)
Monocytes Absolute: 0.5 10*3/uL (ref 0.1–1.0)
Neutro Abs: 2.4 10*3/uL (ref 1.4–7.7)
Neutrophils Relative %: 51 % (ref 43.0–77.0)
Platelets: 225 10*3/uL (ref 150.0–400.0)
RBC: 4.51 Mil/uL (ref 3.87–5.11)
RDW: 14.7 % (ref 11.5–15.5)
WBC: 4.6 10*3/uL (ref 4.0–10.5)

## 2018-05-09 LAB — COMPREHENSIVE METABOLIC PANEL
ALT: 19 U/L (ref 0–35)
AST: 16 U/L (ref 0–37)
Albumin: 4.2 g/dL (ref 3.5–5.2)
Alkaline Phosphatase: 69 U/L (ref 39–117)
BUN: 15 mg/dL (ref 6–23)
CALCIUM: 9.5 mg/dL (ref 8.4–10.5)
CO2: 28 meq/L (ref 19–32)
CREATININE: 0.77 mg/dL (ref 0.40–1.20)
Chloride: 100 mEq/L (ref 96–112)
GFR: 100.56 mL/min (ref >60.00)
Glucose, Bld: 83 mg/dL (ref 70–99)
POTASSIUM: 4.4 meq/L (ref 3.5–5.1)
SODIUM: 136 meq/L (ref 135–145)
Total Bilirubin: 0.7 mg/dL (ref 0.2–1.2)
Total Protein: 7.8 g/dL (ref 6.0–8.3)

## 2018-05-09 LAB — TSH: TSH: 0.92 u[IU]/mL (ref 0.35–4.50)

## 2018-05-09 NOTE — Progress Notes (Signed)
Subjective  Chief Complaint  Patient presents with  . Annual Exam    no complaints today, due to mammogram   . Insomnia    patient states sleeping has gotten better.     HPI: Wanda Gross is a 53 y.o. female who presents to [place] at [facility] today for a Female Wellness Visit.  Wellness Visit: annual visit with health maintenance review and exam without Pap Sees gyn for female exam. Declines breast exam.  Doing well. No complaints. Reviewed insomnia, multifactorial in nature, doing better on black cohash, valerian root, and working on behavioral strategies to reduce stress.  Lifestyle: Body mass index is 35.74 kg/m. Wt Readings from Last 3 Encounters:  05/09/18 228 lb 3.2 oz (103.5 kg)  03/21/18 229 lb 6.4 oz (104.1 kg)  02/07/18 227 lb (103 kg)   Diet: general Exercise: intermittently, walking  Patient Active Problem List   Diagnosis Date Noted  . Dysmenorrhea 03/21/2018  . Seasonal allergic rhinitis due to pollen 03/21/2018  . Obesity with body mass index 30 or greater 02/07/2018  . Vitamin D deficiency 02/07/2018  . Mild intermittent acute asthmatic bronchitis 02/07/2018   Health Maintenance  Topic Date Due  . Hepatitis C Screening  04-Mar-1965  . HIV Screening  08/11/1979  . MAMMOGRAM  04/19/2018  . INFLUENZA VACCINE  07/31/2018  . PAP SMEAR  04/19/2020  . TETANUS/TDAP  08/01/2023  . COLONOSCOPY  11/19/2024   Immunization History  Administered Date(s) Administered  . Influenza-Unspecified 10/22/2017   We updated and reviewed the patient's past history in detail and it is documented below. Allergies: Patient has No Known Allergies. Past Medical History Patient  has a past medical history of Allergy, Asthma, Fibroid, and Medical history non-contributory. Past Surgical History Patient  has a past surgical history that includes Wisdom tooth extraction and Dilatation & currettage/hysteroscopy with novasure ablation (N/A,  07/24/2013). Family History: Patient family history includes Alcohol abuse in her paternal grandfather; Breast cancer in her maternal aunt and paternal aunt; COPD in her mother; Congestive Heart Failure in her father; Healthy in her brother and daughter; Hypertension in her father and mother; Hypothyroidism in her father and sister; Lung cancer in her maternal grandmother; Ovarian cancer in her paternal grandmother. Social History:  Patient  reports that she has never smoked. She has never used smokeless tobacco. She reports that she drinks alcohol. She reports that she does not use drugs.  Review of Systems: Constitutional: negative for fever or malaise Ophthalmic: negative for photophobia, double vision or loss of vision Cardiovascular: negative for chest pain, dyspnea on exertion, or new LE swelling Respiratory: negative for SOB or persistent cough Gastrointestinal: negative for abdominal pain, change in bowel habits or melena Genitourinary: negative for dysuria or gross hematuria, no abnormal uterine bleeding or disharge Musculoskeletal: negative for new gait disturbance or muscular weakness Integumentary: negative for new or persistent rashes, no breast lumps Neurological: negative for TIA or stroke symptoms Psychiatric: negative for SI or delusions Allergic/Immunologic: negative for hives  Patient Care Team    Relationship Specialty Notifications Start End  Leamon Arnt, MD PCP - General Family Medicine  03/21/18   Everett Graff, MD Consulting Physician Obstetrics and Gynecology  03/21/18     Objective  Vitals: BP 108/74   Pulse 66   Temp 98.1 F (36.7 C)   Ht 5\' 7"  (1.702 m)   Wt 228 lb 3.2 oz (103.5 kg)   BMI 35.74 kg/m  General:  Well developed, well nourished,  no acute distress  Psych:  Alert and orientedx3,normal mood and affect HEENT:  Normocephalic, atraumatic, non-icteric sclera, PERRL, oropharynx is clear without mass or exudate, supple neck without adenopathy,  mass or thyromegaly Cardiovascular:  Normal S1, S2, RRR without gallop, rub or murmur, nondisplaced PMI Respiratory:  Good breath sounds bilaterally, CTAB with normal respiratory effort Gastrointestinal: normal bowel sounds, soft, non-tender, no noted masses. No HSM MSK: no deformities, contusions. Joints are without erythema or swelling. Spine and CVA region are nontender Skin:  Warm, no rashes or suspicious lesions noted Neurologic:    Mental status is normal. CN 2-11 are normal. Gross motor and sensory exams are normal. Normal gait. No tremor  Assessment  1. Annual physical exam      Plan  Female Wellness Visit:  Age appropriate Health Maintenance and Prevention measures were discussed with patient. Included topics are cancer screening recommendations, ways to keep healthy (see AVS) including dietary and exercise recommendations, regular eye and dental care, use of seat belts, and avoidance of moderate alcohol use and tobacco use. Mammogram today  BMI: discussed patient's BMI and encouraged positive lifestyle modifications to help get to or maintain a target BMI.  HM needs and immunizations were addressed and ordered. See below for orders. See HM and immunization section for updates.  Routine labs and screening tests ordered including cmp, cbc and lipids where appropriate.  Discussed recommendations regarding Vit D and calcium supplementation (see AVS)  Insomnia - improving. Continue herbal tx and behavioral mgt.    Follow up: Return in about 1 year (around 05/10/2019) for complete physical.   Commons side effects, risks, benefits, and alternatives for medications and treatment plan prescribed today were discussed, and the patient expressed understanding of the given instructions. Patient is instructed to call or message via MyChart if he/she has any questions or concerns regarding our treatment plan. No barriers to understanding were identified. We discussed Red Flag symptoms and  signs in detail. Patient expressed understanding regarding what to do in case of urgent or emergency type symptoms.   Medication list was reconciled, printed and provided to the patient in AVS. Patient instructions and summary information was reviewed with the patient as documented in the AVS. This note was prepared with assistance of Dragon voice recognition software. Occasional wrong-word or sound-a-like substitutions may have occurred due to the inherent limitations of voice recognition software  Orders Placed This Encounter  Procedures  . MM Digital Screening  . CBC with Differential/Platelet  . Comprehensive metabolic panel  . Lipid panel  . TSH  . Hepatitis C antibody  . HIV antibody   No orders of the defined types were placed in this encounter.

## 2018-05-09 NOTE — Patient Instructions (Addendum)
It was so good seeing you again! Thank you for allowing me to continue caring for you. It means a lot to me.   Please schedule a follow up appointment with me in one year for your Physical.   Please go to the Lab for blood work.    If you have MyChart, your results will be available to view, please respond through [place] with questions.  We will schedule follow-up according to results.   Please do these things to maintain good health!   Exercise at least 30-45 minutes a day,  4-5 days a week.   Eat a low-fat diet with lots of fruits and vegetables, up to 7-9 servings per day.  Drink plenty of water daily. Try to drink 8 8oz glasses per day.  Seatbelts can save your life. Always wear your seatbelt.  Place Smoke Detectors on every level of your home and check batteries every year.  Schedule an appointment with an eye doctor for an eye exam every 1-2 years  Safe sex - use condoms to protect yourself from STDs if you could be exposed to these types of infections. Use birth control if you do not want to become pregnant and are sexually active.  Avoid heavy alcohol use. If you drink, keep it to less than 2 drinks/day and not every day.  [place].  Choose someone you trust that could speak for you if you became unable to speak for yourself.  Depression is common in our stressful world.If you're feeling down or losing interest in things you normally enjoy, please come in for a visit.  If anyone is threatening or hurting you, please get help. Physical or Emotional Violence is never OK.

## 2018-05-10 LAB — HEPATITIS C ANTIBODY
Hepatitis C Ab: NONREACTIVE
SIGNAL TO CUT-OFF: 0.17 (ref <1.00)

## 2018-05-10 LAB — HIV ANTIBODY (ROUTINE TESTING W REFLEX): HIV 1&2 Ab, 4th Generation: NONREACTIVE

## 2018-05-13 ENCOUNTER — Ambulatory Visit
Admission: RE | Admit: 2018-05-13 | Discharge: 2018-05-13 | Disposition: A | Discharge status: Home / Self Care | Payer: BLUE CROSS/BLUE SHIELD | Source: Ambulatory Visit | Attending: Family Medicine | Admitting: Family Medicine

## 2018-05-13 DIAGNOSIS — Z0000 Encounter for general adult medical examination without abnormal findings: Secondary | ICD-10-CM

## 2018-05-13 DIAGNOSIS — Z1231 Encounter for screening mammogram for malignant neoplasm of breast: Secondary | ICD-10-CM | POA: Diagnosis not present

## 2018-05-19 ENCOUNTER — Encounter: Payer: Self-pay | Admitting: Family Medicine

## 2018-07-14 ENCOUNTER — Ambulatory Visit: Payer: Self-pay | Admitting: Medical

## 2018-07-17 ENCOUNTER — Ambulatory Visit: Payer: Self-pay | Admitting: Adult Health

## 2018-08-14 NOTE — Telephone Encounter (Signed)
error 

## 2018-09-25 ENCOUNTER — Other Ambulatory Visit: Payer: Self-pay

## 2018-09-25 ENCOUNTER — Ambulatory Visit: Payer: BLUE CROSS/BLUE SHIELD | Admitting: Family Medicine

## 2018-09-25 ENCOUNTER — Encounter: Payer: Self-pay | Admitting: Family Medicine

## 2018-09-25 VITALS — BP 124/80 | HR 86 | Temp 98.8°F | Ht 67.0 in | Wt 228.2 lb

## 2018-09-25 DIAGNOSIS — J0100 Acute maxillary sinusitis, unspecified: Secondary | ICD-10-CM | POA: Diagnosis not present

## 2018-09-25 MED ORDER — FLUCONAZOLE 150 MG PO TABS: ORAL_TABLET | ORAL | 0 refills | Status: DC

## 2018-09-25 MED ORDER — AMOXICILLIN 875 MG PO TABS: 875.0000 mg | ORAL_TABLET | Freq: Two times a day (BID) | ORAL | 0 refills | Status: AC

## 2018-09-25 NOTE — Progress Notes (Signed)
Subjective   CC:  Chief Complaint  Patient presents with  . Ear Pain    Right Ear Pain x 3 days     HPI: Wanda Gross is a 53 y.o. female who presents to the office today to address the problems listed above in the chief complaint.  Patient reports sinus congestion and pressure with left sinus pain, left ear pressure without pain, and mild malaise.  Symptoms have been present for several days.Shedenies high fevers, GI symptoms, shortness of breath. Shehas had sinus infections in the past and this feels similar.  Patient is a non-smoker.  No history of asthma or COPD. Has been out of work x 2 days due to malaise.  I reviewed the patients updated PMH, FH, and SocHx.    Patient Active Problem List   Diagnosis Date Noted  . Dysmenorrhea 03/21/2018  . Seasonal allergic rhinitis due to pollen 03/21/2018  . Obesity with body mass index 30 or greater 02/07/2018  . Vitamin D deficiency 02/07/2018  . Mild intermittent acute asthmatic bronchitis 02/07/2018   Current Meds  Medication Sig  . albuterol (PROVENTIL HFA;VENTOLIN HFA) 108 (90 Base) MCG/ACT inhaler Inhale 2 puffs into the lungs every 6 (six) hours as needed for wheezing or shortness of breath.  . beclomethasone (QVAR REDIHALER) 80 MCG/ACT inhaler Inhale 1 puff into the lungs 2 (two) times daily.  . Chlorpheniramine-Phenylephrine (SINUS & ALLERGY PO) Take 1 tablet by mouth at bedtime as needed (for congestion).  Marland Kitchen ibuprofen (ADVIL,MOTRIN) 600 MG tablet ibuprofen 600 mg tablet  . ipratropium (ATROVENT) 0.06 % nasal spray ipratropium bromide 42 mcg (0.06 %) nasal spray    Review of Systems: Cardiovascular: negative for chest pain Respiratory: negative for SOB or persistent cough Gastrointestinal: negative for abdominal pain Genitourinary: negative for dysuria or gross hematuria  Objective  Vitals: BP 124/80   Pulse 86   Temp 98.8 F (37.1 C)   Ht 5\' 7"  (1.702 m)   Wt 228 lb 3.2 oz (103.5 kg)   SpO2 99%   BMI 35.74 kg/m   General: no acute distress  Psych:  Alert and oriented, normal mood and affect HEENT:  Normocephalic, atraumatic, TMs with serous effusions or retraction w/o erythema, nasal mucosa is red with purulent drainage, tender maxillary sinus present, OP mild erythematous w/o eudate, supple neck with LAD Cardiovascular:  RRR without murmur or gallop. no peripheral edema Respiratory:  Good breath sounds bilaterally, CTAB with normal respiratory effort Skin:  Warm, no rashes Neurologic:   Mental status is normal. normal gait  Assessment  1. Acute non-recurrent maxillary sinusitis      Plan    Sinusitis: History and exam is most consistent with bacterial sinus infection.  Etiology and prognosis discussed with patient.  Recommend antibiotics as ordered below.  Patient to complete course of antibiotics, use supportive medications like mucolytics and decongestants as needed.  May use Tylenol or Advil if needed.  Symptoms should improve over the next 2 weeks.  Patient will return or call if symptoms persist or worsen.  Follow up: Return if symptoms worsen or fail to improve.    Commons side effects, risks, benefits, and alternatives for medications and treatment plan prescribed today were discussed, and the patient expressed understanding of the given instructions. Patient is instructed to call or message via MyChart if he/she has any questions or concerns regarding our treatment plan. No barriers to understanding were identified. We discussed Red Flag symptoms and signs in detail. Patient expressed understanding regarding what  to do in case of urgent or emergency type symptoms.   Medication list was reconciled, printed and provided to the patient in AVS. Patient instructions and summary information was reviewed with the patient as documented in the AVS. This note was prepared with assistance of Dragon voice recognition software. Occasional wrong-word or sound-a-like substitutions may have occurred due to  the inherent limitations of voice recognition software  No orders of the defined types were placed in this encounter.  Meds ordered this encounter  Medications  . amoxicillin (AMOXIL) 875 MG tablet    Sig: Take 1 tablet (875 mg total) by mouth 2 (two) times daily for 10 days.    Dispense:  20 tablet    Refill:  0  . fluconazole (DIFLUCAN) 150 MG tablet    Sig: Take one tablet today; may repeat in 3 days if symptoms persist    Dispense:  2 tablet    Refill:  0

## 2018-09-25 NOTE — Patient Instructions (Addendum)
Mucinex or Mucinex DM Advil for headache or pain.+Antibiotic if not improving.    Sinusitis, Adult Sinusitis is soreness and inflammation of your sinuses. Sinuses are hollow spaces in the bones around your face. They are located:  Around your eyes.  In the middle of your forehead.  Behind your nose.  In your cheekbones.  Your sinuses and nasal passages are lined with a stringy fluid (mucus). Mucus normally drains out of your sinuses. When your nasal tissues get inflamed or swollen, the mucus can get trapped or blocked so air cannot flow through your sinuses. This lets bacteria, viruses, and funguses grow, and that leads to infection. Follow these instructions at home: Medicines  Take, use, or apply over-the-counter and prescription medicines only as told by your doctor. These may include nasal sprays.  If you were prescribed an antibiotic medicine, take it as told by your doctor. Do not stop taking the antibiotic even if you start to feel better. Hydrate and Humidify  Drink enough water to keep your pee (urine) clear or pale yellow.  Use a cool mist humidifier to keep the humidity level in your home above 50%.  Breathe in steam for 10-15 minutes, 3-4 times a day or as told by your doctor. You can do this in the bathroom while a hot shower is running.  Try not to spend time in cool or dry air. Rest  Rest as much as possible.  Sleep with your head raised (elevated).  Make sure to get enough sleep each night. General instructions  Put a warm, moist washcloth on your face 3-4 times a day or as told by your doctor. This will help with discomfort.  Wash your hands often with soap and water. If there is no soap and water, use hand sanitizer.  Do not smoke. Avoid being around people who are smoking (secondhand smoke).  Keep all follow-up visits as told by your doctor. This is important. Contact a doctor if:  You have a fever.  Your symptoms get worse.  Your symptoms do  not get better within 10 days. Get help right away if:  You have a very bad headache.  You cannot stop throwing up (vomiting).  You have pain or swelling around your face or eyes.  You have trouble seeing.  You feel confused.  Your neck is stiff.  You have trouble breathing. This information is not intended to replace advice given to you by your health care provider. Make sure you discuss any questions you have with your health care provider. Document Released: 06/04/2008 Document Revised: 08/12/2016 Document Reviewed: 10/12/2015 Elsevier Interactive Patient Education  [employer].

## 2018-12-11 ENCOUNTER — Other Ambulatory Visit: Payer: Self-pay

## 2018-12-11 ENCOUNTER — Encounter: Payer: Self-pay | Admitting: Family Medicine

## 2018-12-11 ENCOUNTER — Ambulatory Visit: Payer: BLUE CROSS/BLUE SHIELD | Admitting: Family Medicine

## 2018-12-11 VITALS — BP 128/82 | HR 67 | Temp 97.9°F | Resp 14 | Ht 67.0 in | Wt 227.0 lb

## 2018-12-11 DIAGNOSIS — T162XXA Foreign body in left ear, initial encounter: Secondary | ICD-10-CM | POA: Diagnosis not present

## 2018-12-11 DIAGNOSIS — Z23 Encounter for immunization: Secondary | ICD-10-CM | POA: Diagnosis not present

## 2018-12-11 DIAGNOSIS — J301 Allergic rhinitis due to pollen: Secondary | ICD-10-CM | POA: Diagnosis not present

## 2018-12-11 DIAGNOSIS — H6993 Unspecified Eustachian tube disorder, bilateral: Secondary | ICD-10-CM

## 2018-12-11 DIAGNOSIS — H6983 Other specified disorders of Eustachian tube, bilateral: Secondary | ICD-10-CM

## 2018-12-11 MED ORDER — FLUTICASONE PROPIONATE 50 MCG/ACT NA SUSP: 2.0000 | Freq: Every day | NASAL | 6 refills | Status: DC

## 2018-12-11 NOTE — Progress Notes (Signed)
Subjective  CC:  Chief Complaint  Patient presents with  . Ear Fullness    States she feels like she went swimming, no pain just some pressure.  Has some sinus drainage. Been going on since Thanksgiving.     HPI: Wanda Gross is a 53 y.o. female who presents to the office today to address the problems listed above in the chief complaint.  Patient complains of persistent fullness in bilateral ears.  She also complains of a tickling or irritant sensation in the left ear.  This is been ongoing for weeks.  The fullness was present since her sinus infection in September.  She admits to allergy symptoms including postnasal drainage and rhinitis.  No significant coughing.  No fevers, chills or sinus pain.  She was exposed to multiple people with URIs over the holidays.  No cough.  No ear pain. Assessment  1. Foreign body in ear, left, initial encounter   2. Seasonal allergic rhinitis due to pollen   3. Dysfunction of both eustachian tubes      Plan   Foreign body left external auditory canal: Resolved with irrigation.  Hair.  Irritant sensation was resolved  Allergic rhinitis and eustachian tube dysfunction: Counseling and education done.  Add Flonase to [employer].  Valsalva maneuver as needed.  Reassured  Flu shot given today.  Follow up: Return in about 5 months (around 05/12/2019) for complete physical.   No orders of the defined types were placed in this encounter.  Meds ordered this encounter  Medications  . fluticasone (FLONASE) 50 MCG/ACT nasal spray    Sig: Place 2 sprays into both nostrils daily.    Dispense:  16 g    Refill:  6      I reviewed the patients updated PMH, FH, and SocHx.    Patient Active Problem List   Diagnosis Date Noted  . Dysmenorrhea 03/21/2018  . Seasonal allergic rhinitis due to pollen 03/21/2018  . Obesity with body mass index 30 or greater 02/07/2018  . Vitamin D deficiency 02/07/2018  . Mild intermittent acute asthmatic bronchitis 02/07/2018    Current Meds  Medication Sig  . albuterol (PROVENTIL HFA;VENTOLIN HFA) 108 (90 Base) MCG/ACT inhaler Inhale 2 puffs into the lungs every 6 (six) hours as needed for wheezing or shortness of breath.  . beclomethasone (QVAR REDIHALER) 80 MCG/ACT inhaler Inhale 1 puff into the lungs 2 (two) times daily.  . Chlorpheniramine-Phenylephrine (SINUS & ALLERGY PO) Take 1 tablet by mouth at bedtime as needed (for congestion).  . Ergocalciferol (VITAMIN D2 PO) Take 1,250 mcg by mouth 2 (two) times a week.  . fluconazole (DIFLUCAN) 150 MG tablet Take one tablet today; may repeat in 3 days if symptoms persist  . ibuprofen (ADVIL,MOTRIN) 600 MG tablet ibuprofen 600 mg tablet  . ipratropium (ATROVENT) 0.06 % nasal spray ipratropium bromide 42 mcg (0.06 %) nasal spray    Allergies: Patient has No Known Allergies. Family History: Patient family history includes Alcohol abuse in her paternal grandfather; Breast cancer in her maternal aunt and paternal aunt; COPD in her mother; Congestive Heart Failure in her father; Healthy in her brother and daughter; Hypertension in her father and mother; Hypothyroidism in her father and sister; Lung cancer in her maternal grandmother; Ovarian cancer in her paternal grandmother. Social History:  Patient  reports that she has never smoked. She has never used smokeless tobacco. She reports current alcohol use. She reports that she does not use drugs.  Review of Systems: Constitutional: Negative  for fever malaise or anorexia Cardiovascular: negative for chest pain Respiratory: negative for SOB or persistent cough Gastrointestinal: negative for abdominal pain  Objective  Vitals: BP 128/82   Pulse 67   Temp 97.9 F (36.6 C) (Oral)   Resp 14   Ht 5\' 7"  (1.702 m)   Wt 227 lb (103 kg)   SpO2 98%   BMI 35.55 kg/m  General: no acute distress , A&Ox3 HEENT: PEERL, conjunctiva normal, Oropharynx moist,neck is supple, left auditory canal with small black spiraled hair  present.  Irrigated.  Bilateral TMs are clear with normal landmarks, however serous effusions present Cardiovascular:  RRR without murmur or gallop.  Respiratory:  Good breath sounds bilaterally, CTAB with normal respiratory effort Skin:  Warm, no rashes     Commons side effects, risks, benefits, and alternatives for medications and treatment plan prescribed today were discussed, and the patient expressed understanding of the given instructions. Patient is instructed to call or message via MyChart if he/she has any questions or concerns regarding our treatment plan. No barriers to understanding were identified. We discussed Red Flag symptoms and signs in detail. Patient expressed understanding regarding what to do in case of urgent or emergency type symptoms.   Medication list was reconciled, printed and provided to the patient in AVS. Patient instructions and summary information was reviewed with the patient as documented in the AVS. This note was prepared with assistance of Dragon voice recognition software. Occasional wrong-word or sound-a-like substitutions may have occurred due to the inherent limitations of voice recognition software

## 2018-12-11 NOTE — Patient Instructions (Signed)
Please return in May 2020 for your annual complete physical; please come fasting.  Add flonase daily to your allegra.   If you have any questions or concerns, please don't hesitate to send me a message via MyChart or call the office at [phone]. Thank you for visiting with [place] today! It's our pleasure caring for you.   Eustachian Tube Dysfunction The eustachian tube connects the middle ear to the back of the nose. It regulates air pressure in the middle ear by allowing air to move between the ear and nose. It also helps to drain fluid from the middle ear space. When the eustachian tube does not function properly, air pressure, fluid, or both can build up in the middle ear. Eustachian tube dysfunction can affect one or both ears. What are the causes? This condition happens when the eustachian tube becomes blocked or cannot open normally. This may result from:  Ear infections.  Colds and other upper respiratory infections.  Allergies.  Irritation, such as from cigarette smoke or acid from the stomach coming up into the esophagus (gastroesophageal reflux).  Sudden changes in air pressure, such as from descending in an airplane.  Abnormal growths in the nose or throat, such as nasal polyps, tumors, or enlarged tissue at the back of the throat (adenoids).  What increases the risk? This condition may be more likely to develop in people who smoke and people who are overweight. Eustachian tube dysfunction may also be more likely to develop in children, especially children who have:  Certain birth defects of the mouth, such as cleft palate.  Large tonsils and adenoids.  What are the signs or symptoms? Symptoms of this condition may include:  A feeling of fullness in the ear.  Ear pain.  Clicking or popping noises in the ear.  Ringing in the ear.  Hearing loss.  Loss of balance.  Symptoms may get worse when the air pressure around you changes, such as when you travel to an  area of high elevation or fly on an airplane. How is this diagnosed? This condition may be diagnosed based on:  Your symptoms.  A physical exam of your ear, nose, and throat.  Tests, such as those that measure: ? The movement of your eardrum (tympanogram). ? Your hearing (audiometry).  How is this treated? Treatment depends on the cause and severity of your condition. If your symptoms are mild, you may be able to relieve your symptoms by moving air into ("popping") your ears. If you have symptoms of fluid in your ears, treatment may include:  Decongestants.  Antihistamines.  Nasal sprays or ear drops that contain medicines that reduce swelling (steroids).  In some cases, you may need to have a procedure to drain the fluid in your eardrum (myringotomy). In this procedure, a small tube is placed in the eardrum to:  Drain the fluid.  Restore the air in the middle ear space.  Follow these instructions at home:  Take over-the-counter and prescription medicines only as told by your health care provider.  Use techniques to help pop your ears as recommended by your health care provider. These may include: ? Chewing gum. ? Yawning. ? Frequent, forceful swallowing. ? Closing your mouth, holding your nose closed, and gently blowing as if you are trying to blow air out of your nose.  Do not do any of the following until your health care provider approves: ? Travel to high altitudes. ? Fly in airplanes. ? Work in a Pension scheme manager or room. ?  Scuba dive.  Keep your ears dry. Dry your ears completely after showering or bathing.  Do not smoke.  Keep all follow-up visits as told by your health care provider. This is important. Contact a health care provider if:  Your symptoms do not go away after treatment.  Your symptoms come back after treatment.  You are unable to pop your ears.  You have: ? A fever. ? Pain in your ear. ? Pain in your head or neck. ? Fluid draining  from your ear.  Your hearing suddenly changes.  You become very dizzy.  You lose your balance. This information is not intended to replace advice given to you by your health care provider. Make sure you discuss any questions you have with your health care provider. Document Released: 01/13/2016 Document Revised: 05/24/2016 Document Reviewed: 01/05/2015 Elsevier Interactive Patient Education  [employer].

## 2019-02-16 ENCOUNTER — Encounter: Payer: Self-pay | Admitting: Family Medicine

## 2019-02-16 ENCOUNTER — Other Ambulatory Visit: Payer: Self-pay

## 2019-02-16 ENCOUNTER — Ambulatory Visit: Payer: BLUE CROSS/BLUE SHIELD | Admitting: Family Medicine

## 2019-02-16 VITALS — BP 116/64 | HR 77 | Temp 98.5°F | Resp 16 | Ht 67.0 in | Wt 226.6 lb

## 2019-02-16 DIAGNOSIS — J0100 Acute maxillary sinusitis, unspecified: Secondary | ICD-10-CM

## 2019-02-16 MED ORDER — AMOXICILLIN-POT CLAVULANATE 875-125 MG PO TABS: 1.0000 | ORAL_TABLET | Freq: Two times a day (BID) | ORAL | 0 refills | Status: DC

## 2019-02-16 NOTE — Progress Notes (Signed)
Subjective   CC:  Chief Complaint  Patient presents with  . Sinusitis    Started over the weekend, has tried Delsym and Mucinex    HPI: Wanda Gross is a 54 y.o. female who presents to the office today to address the problems listed above in the chief complaint.  Patient reports sinus congestion and pressure with thick drainage, mild nonproductive cough, ear pressure without pain, and mild malaise.  Symptoms have been present for several days. Has had dental pain and facial pain. Wanda Gross high fevers, GI symptoms, shortness of breath. Shehas had sinus infections in the past and this feels similar.  Patient is a non-smoker.  No history of asthma or COPD.  I reviewed the patients updated PMH, FH, and SocHx.    Patient Active Problem List   Diagnosis Date Noted  . Dysmenorrhea 03/21/2018  . Seasonal allergic rhinitis due to pollen 03/21/2018  . Obesity with body mass index 30 or greater 02/07/2018  . Vitamin D deficiency 02/07/2018  . Mild intermittent acute asthmatic bronchitis 02/07/2018   Current Meds  Medication Sig  . albuterol (PROVENTIL HFA;VENTOLIN HFA) 108 (90 Base) MCG/ACT inhaler Inhale 2 puffs into the lungs every 6 (six) hours as needed for wheezing or shortness of breath.  . beclomethasone (QVAR REDIHALER) 80 MCG/ACT inhaler Inhale 1 puff into the lungs 2 (two) times daily.  . fluticasone (FLONASE) 50 MCG/ACT nasal spray Place 2 sprays into both nostrils daily.  Marland Kitchen ibuprofen (ADVIL,MOTRIN) 600 MG tablet ibuprofen 600 mg tablet  . [DISCONTINUED] Ergocalciferol (VITAMIN D2 PO) Take 1,250 mcg by mouth 2 (two) times a week.  . [DISCONTINUED] ipratropium (ATROVENT) 0.06 % nasal spray ipratropium bromide 42 mcg (0.06 %) nasal spray    Review of Systems: Cardiovascular: negative for chest pain Respiratory: negative for SOB or persistent cough Gastrointestinal: negative for abdominal pain Genitourinary: negative for dysuria or gross hematuria  Objective  Vitals: BP  116/64   Pulse 77   Temp 98.5 F (36.9 C) (Oral)   Resp 16   Ht 5\' 7"  (1.702 m)   Wt 226 lb 9.6 oz (102.8 kg)   SpO2 98%   BMI 35.49 kg/m  General: no acute distress  Psych:  Alert and oriented, normal mood and affect HEENT:  Normocephalic, atraumatic, TMs with serous effusions or retraction w/o erythema, nasal mucosa is red with purulent drainage, tender maxillary sinus present, OP mild erythematous w/o eudate, supple neck with LAD Cardiovascular:  RRR without murmur or gallop. no peripheral edema Respiratory:  Good breath sounds bilaterally, CTAB with normal respiratory effort Skin:  Warm, no rashes Neurologic:   Mental status is normal. normal gait  Assessment  1. Acute non-recurrent maxillary sinusitis      Plan    Sinusitis: History and exam is most consistent with bacterial sinus infection.  Etiology and prognosis discussed with patient.  Recommend antibiotics as ordered below.  Patient to complete course of antibiotics, use supportive medications like mucolytics and decongestants as needed.  May use Tylenol or Advil if needed.  Symptoms should improve over the next 2 weeks.  Patient will return or call if symptoms persist or worsen.  Follow up: Return in about 3 months (around 05/17/2019) for complete physical.    Commons side effects, risks, benefits, and alternatives for medications and treatment plan prescribed today were discussed, and the patient expressed understanding of the given instructions. Patient is instructed to call or message via MyChart if he/she has any questions or concerns regarding our treatment  plan. No barriers to understanding were identified. We discussed Red Flag symptoms and signs in detail. Patient expressed understanding regarding what to do in case of urgent or emergency type symptoms.   Medication list was reconciled, printed and provided to the patient in AVS. Patient instructions and summary information was reviewed with the patient as documented  in the AVS. This note was prepared with assistance of Dragon voice recognition software. Occasional wrong-word or sound-a-like substitutions may have occurred due to the inherent limitations of voice recognition software  No orders of the defined types were placed in this encounter.  Meds ordered this encounter  Medications  . amoxicillin-clavulanate (AUGMENTIN) 875-125 MG tablet    Sig: Take 1 tablet by mouth 2 (two) times daily.    Dispense:  14 tablet    Refill:  0

## 2019-02-16 NOTE — Patient Instructions (Addendum)
Please return in may 2020 for your annual complete physical; please come fasting.   If you have any questions or concerns, please don't hesitate to send me a message via MyChart or call the office at [phone]. Thank you for visiting with [place] today! It's our pleasure caring for you.   Sinusitis, Adult Sinusitis is soreness and swelling (inflammation) of your sinuses. Sinuses are hollow spaces in the bones around your face. They are located:  Around your eyes.  In the middle of your forehead.  Behind your nose.  In your cheekbones. Your sinuses and nasal passages are lined with a fluid called mucus. Mucus drains out of your sinuses. Swelling can trap mucus in your sinuses. This lets germs (bacteria, virus, or fungus) grow, which leads to infection. Most of the time, this condition is caused by a virus. What are the causes? This condition is caused by:  Allergies.  Asthma.  Germs.  Things that block your nose or sinuses.  Growths in the nose (nasal polyps).  Chemicals or irritants in the air.  Fungus (rare). What increases the risk? You are more likely to develop this condition if:  You have a weak body defense system (immune system).  You do a lot of swimming or diving.  You use nasal sprays too much.  You smoke. What are the signs or symptoms? The main symptoms of this condition are pain and a feeling of pressure around the sinuses. Other symptoms include:  Stuffy nose (congestion).  Runny nose (drainage).  Swelling and warmth in the sinuses.  Headache.  Toothache.  A cough that may get worse at night.  Mucus that collects in the throat or the back of the nose (postnasal drip).  Being unable to smell and taste.  Being very tired (fatigue).  A fever.  Sore throat.  Bad breath. How is this diagnosed? This condition is diagnosed based on:  Your symptoms.  Your medical history.  A physical exam.  Tests to find out if your condition is  short-term (acute) or long-term (chronic). Your doctor may: ? Check your nose for growths (polyps). ? Check your sinuses using a tool that has a light (endoscope). ? Check for allergies or germs. ? Do imaging tests, such as an MRI or CT scan. How is this treated? Treatment for this condition depends on the cause and whether it is short-term or long-term.  If caused by a virus, your symptoms should go away on their own within 10 days. You may be given medicines to relieve symptoms. They include: ? Medicines that shrink swollen tissue in the nose. ? Medicines that treat allergies (antihistamines). ? A spray that treats swelling of the nostrils. ? Rinses that help get rid of thick mucus in your nose (nasal saline washes).  If caused by bacteria, your doctor may wait to see if you will get better without treatment. You may be given antibiotic medicine if you have: ? A very bad infection. ? A weak body defense system.  If caused by growths in the nose, you may need to have surgery. Follow these instructions at home: Medicines  Take, use, or apply over-the-counter and prescription medicines only as told by your doctor. These may include nasal sprays.  If you were prescribed an antibiotic medicine, take it as told by your doctor. Do not stop taking the antibiotic even if you start to feel better. Hydrate and humidify   Drink enough water to keep your pee (urine) pale yellow.  Use a cool  mist humidifier to keep the humidity level in your home above 50%.  Breathe in steam for 10-15 minutes, 3-4 times a day, or as told by your doctor. You can do this in the bathroom while a hot shower is running.  Try not to spend time in cool or dry air. Rest  Rest as much as you can.  Sleep with your head raised (elevated).  Make sure you get enough sleep each night. General instructions   Put a warm, moist washcloth on your face 3-4 times a day, or as often as told by your doctor. This will  help with discomfort.  Wash your hands often with soap and water. If there is no soap and water, use hand sanitizer.  Do not smoke. Avoid being around people who are smoking (secondhand smoke).  Keep all follow-up visits as told by your doctor. This is important. Contact a doctor if:  You have a fever.  Your symptoms get worse.  Your symptoms do not get better within 10 days. Get help right away if:  You have a very bad headache.  You cannot stop throwing up (vomiting).  You have very bad pain or swelling around your face or eyes.  You have trouble seeing.  You feel confused.  Your neck is stiff.  You have trouble breathing. Summary  Sinusitis is swelling of your sinuses. Sinuses are hollow spaces in the bones around your face.  This condition is caused by tissues in your nose that become inflamed or swollen. This traps germs. These can lead to infection.  If you were prescribed an antibiotic medicine, take it as told by your doctor. Do not stop taking it even if you start to feel better.  Keep all follow-up visits as told by your doctor. This is important. This information is not intended to replace advice given to you by your health care provider. Make sure you discuss any questions you have with your health care provider. Document Released: 06/04/2008 Document Revised: 05/19/2018 Document Reviewed: 05/19/2018 Elsevier Interactive Patient Education  2019 [employer].

## 2019-04-07 ENCOUNTER — Encounter: Payer: Self-pay | Admitting: Family Medicine

## 2019-05-05 ENCOUNTER — Other Ambulatory Visit: Payer: Self-pay

## 2019-05-05 ENCOUNTER — Telehealth: Payer: Self-pay | Admitting: Nurse Practitioner

## 2019-05-05 DIAGNOSIS — J301 Allergic rhinitis due to pollen: Secondary | ICD-10-CM

## 2019-05-05 MED ORDER — FLUTICASONE PROPIONATE 50 MCG/ACT NA SUSP: NASAL | 1 refills | Status: AC

## 2019-05-05 MED ORDER — FEXOFENADINE HCL 180 MG PO TABS: 180.0000 mg | ORAL_TABLET | Freq: Every day | ORAL | 0 refills | Status: DC

## 2019-05-05 NOTE — Patient Instructions (Addendum)
Take meds as directed Fluids and rest Try to avoid environmental allergens until you start to feel better Encouraged patient to call  for an appointment if no improvement in symptoms or if symptoms change or worsen after 72 hours of planned treatment. Patient verbalized understanding of all instructions given/reviewed and has no further questions or concerns at this time.     Allergic Rhinitis, Adult Allergic rhinitis is a reaction to allergens in the air. Allergens are tiny specks (particles) in the air that cause your body to have an allergic reaction. This condition cannot be passed from person to person (is not contagious). Allergic rhinitis cannot be cured, but it can be controlled. There are two types of allergic rhinitis:  Seasonal. This type is also called hay fever. It happens only during certain times of the year.  Perennial. This type can happen at any time of the year. What are the causes?  This condition may be caused by:  Pollen from grasses, trees, and weeds.  House dust mites.  Pet dander.  Mold. What are the signs or symptoms? Symptoms of this condition include:  Sneezing.  Runny or stuffy nose (nasal congestion).  A lot of mucus in the back of the throat (postnasal drip).  Itchy nose.  Tearing of the eyes.  Trouble sleeping.  Being sleepy during day. How is this treated? There is no cure for this condition. You should avoid things that trigger your symptoms (allergens). Treatment can help to relieve symptoms. This may include:  Medicines that block allergy symptoms, such as antihistamines. These may be given as a shot, nasal spray, or pill.  Shots that are given until your body becomes less sensitive to the allergen (desensitization).  Stronger medicines, if all other treatments have not worked. Follow these instructions at home: Avoiding allergens    Find out what you are allergic to. Common allergens include smoke, dust, and pollen.  Avoid  them if you can. These are some of the things that you can do to avoid allergens: ? Replace carpet with wood, tile, or vinyl flooring. Carpet can trap dander and dust. ? Clean any mold found in the home. ? Do not smoke. Do not allow smoking in your home. ? Change your heating and air conditioning filter at least once a month. ? During allergy season:  Keep windows closed as much as you can. If possible, use air conditioning when there is a lot of pollen in the air.  Use a special filter for allergies with your furnace and air conditioner.  Plan outdoor activities when pollen counts are lowest. This is usually during the early morning or evening hours.  If you do go outdoors when pollen count is high, wear a special mask for people with allergies.  When you come indoors, take a shower and change your clothes before sitting on furniture or bedding. General instructions  Do not use fans in your home.  Do not hang clothes outside to dry.  Wear sunglasses to keep pollen out of your eyes.  Wash your hands right away after you touch household pets.  Take over-the-counter and prescription medicines only as told by your doctor.  Keep all follow-up visits as told by your doctor. This is important. Contact a doctor if:  You have a fever.  You have a cough that does not go away (is persistent).  You start to make whistling sounds when you breathe (wheeze).  Your symptoms do not get better with treatment.  You have thick  fluid coming from your nose.  You start to have nosebleeds. Get help right away if:  Your tongue or your lips are swollen.  You have trouble breathing.  You feel dizzy or you feel like you are going to pass out (faint).  You have cold sweats. Summary  Allergic rhinitis is a reaction to allergens in the air.  This condition may be caused by allergens. These include pollen, dust mites, pet dander, and mold.  Symptoms include a runny, itchy nose, sneezing, or  tearing eyes. You may also have trouble sleeping or feel sleepy during the day.  Treatment includes taking medicines and avoiding allergens. You may also get shots or take stronger medicines.  Get help if you have a fever or a cough that does not stop. Get help right away if you are short of breath. This information is not intended to replace advice given to you by your health care provider. Make sure you discuss any questions you have with your health care provider. Document Released: 04/18/2011 Document Revised: 07/08/2018 Document Reviewed: 07/08/2018 Elsevier Interactive Patient Education  2019 [employer].

## 2019-05-05 NOTE — Progress Notes (Signed)
   Subjective:    Patient ID: Wanda Gross, female    DOB: 1965-05-23, 55 y.o.   MRN: [id number]  HPI Wanda Gross c/o right ear pain x 2-3 days and throat irritation x 2 weeks which has intensified. She also c/o slight facial pressure but denies facial pain and not that bothersome. She feels that going for a walk has exacerbated these symptoms and was thinking pollen was no longer prevalent. She does reports coughing up yellow phlegm in the am but no persistent coughing throughout the day. Denies fever, sneezing, SOB, wheezing, facial pain, n/v/d. Has taken ibuprofen as directed but not currently taking routine allergy meds.    Verbal consent for treatment given  Review of Systems  Constitutional: Negative for fatigue and fever.  HENT: Positive for ear pain, postnasal drip and sore throat. Negative for rhinorrhea, sinus pressure and sneezing.   Respiratory: Negative for shortness of breath and wheezing.   Cardiovascular: Negative for chest pain.  Gastrointestinal: Negative for abdominal pain, nausea and vomiting.       Objective:   Physical Exam Answers questions promptly and appropriately. A&Ox3.       Assessment & Plan:

## 2019-05-15 ENCOUNTER — Encounter: Payer: BLUE CROSS/BLUE SHIELD | Admitting: Family Medicine

## 2019-08-04 ENCOUNTER — Other Ambulatory Visit: Payer: Self-pay | Admitting: Obstetrics and Gynecology

## 2019-08-04 ENCOUNTER — Encounter: Payer: Self-pay | Admitting: Family Medicine

## 2019-08-04 ENCOUNTER — Ambulatory Visit (INDEPENDENT_AMBULATORY_CARE_PROVIDER_SITE_OTHER): Payer: BC Managed Care – PPO | Admitting: Family Medicine

## 2019-08-04 ENCOUNTER — Other Ambulatory Visit: Payer: Self-pay

## 2019-08-04 VITALS — BP 122/76 | HR 67 | Temp 98.1°F | Resp 16 | Ht 67.0 in | Wt 220.6 lb

## 2019-08-04 DIAGNOSIS — E559 Vitamin D deficiency, unspecified: Secondary | ICD-10-CM

## 2019-08-04 DIAGNOSIS — Z0000 Encounter for general adult medical examination without abnormal findings: Secondary | ICD-10-CM

## 2019-08-04 DIAGNOSIS — J4520 Mild intermittent asthma, uncomplicated: Secondary | ICD-10-CM

## 2019-08-04 DIAGNOSIS — Z1231 Encounter for screening mammogram for malignant neoplasm of breast: Secondary | ICD-10-CM

## 2019-08-04 LAB — COMPREHENSIVE METABOLIC PANEL
ALT: 18 U/L (ref 0–35)
AST: 15 U/L (ref 0–37)
Albumin: 4.3 g/dL (ref 3.5–5.2)
Alkaline Phosphatase: 88 U/L (ref 39–117)
BUN: 15 mg/dL (ref 6–23)
CO2: 28 mEq/L (ref 19–32)
Calcium: 9.3 mg/dL (ref 8.4–10.5)
Chloride: 106 mEq/L (ref 96–112)
Creatinine, Ser: 0.77 mg/dL (ref 0.40–1.20)
GFR: 94.18 mL/min (ref >60.00)
Glucose, Bld: 90 mg/dL (ref 70–99)
Potassium: 3.9 mEq/L (ref 3.5–5.1)
Sodium: 141 mEq/L (ref 135–145)
Total Bilirubin: 0.6 mg/dL (ref 0.2–1.2)
Total Protein: 7.4 g/dL (ref 6.0–8.3)

## 2019-08-04 LAB — CBC WITH DIFFERENTIAL/PLATELET
Basophils Absolute: 0.1 10*3/uL (ref 0.0–0.1)
Basophils Relative: 0.8 % (ref 0.0–3.0)
Eosinophils Absolute: 0.1 10*3/uL (ref 0.0–0.7)
Eosinophils Relative: 1.2 % (ref 0.0–5.0)
HCT: 38.7 % (ref 36.0–46.0)
Hemoglobin: 12.4 g/dL (ref 12.0–15.0)
Lymphocytes Relative: 28.1 % (ref 12.0–46.0)
Lymphs Abs: 1.9 10*3/uL (ref 0.7–4.0)
MCHC: 32.1 g/dL (ref 30.0–36.0)
MCV: 86.3 fl (ref 78.0–100.0)
Monocytes Absolute: 0.6 10*3/uL (ref 0.1–1.0)
Monocytes Relative: 8.7 % (ref 3.0–12.0)
Neutro Abs: 4 10*3/uL (ref 1.4–7.7)
Neutrophils Relative %: 61.2 % (ref 43.0–77.0)
Platelets: 236 10*3/uL (ref 150.0–400.0)
RBC: 4.49 Mil/uL (ref 3.87–5.11)
RDW: 14.9 % (ref 11.5–15.5)
WBC: 6.6 10*3/uL (ref 4.0–10.5)

## 2019-08-04 LAB — LIPID PANEL
Cholesterol: 157 mg/dL (ref 0–200)
HDL: 58.2 mg/dL (ref >39.00)
LDL Cholesterol: 82 mg/dL (ref 0–99)
NonHDL: 98.67
Total CHOL/HDL Ratio: 3
Triglycerides: 82 mg/dL (ref 0.0–149.0)
VLDL: 16.4 mg/dL (ref 0.0–40.0)

## 2019-08-04 LAB — TSH: TSH: 1.54 u[IU]/mL (ref 0.35–4.50)

## 2019-08-04 LAB — VITAMIN D 25 HYDROXY (VIT D DEFICIENCY, FRACTURES): VITD: 22.39 ng/mL — ABNORMAL LOW (ref 30.00–100.00)

## 2019-08-04 NOTE — Patient Instructions (Signed)
Please return in 12 months for your annual complete physical; please come fasting.  I will release your lab results to you on your MyChart account with further instructions. Please reply with any questions.    If you have any questions or concerns, please don't hesitate to send me a message via MyChart or call the office at [phone]. Thank you for visiting with [place] today! It's our pleasure caring for you.   Preventive Care 24-55 Years Old, Female Preventive care refers to visits with your health care provider and lifestyle choices that can promote health and wellness. This includes:  A yearly physical exam. This may also be called an annual well check.  Regular dental visits and eye exams.  Immunizations.  Screening for certain conditions.  Healthy lifestyle choices, such as eating a healthy diet, getting regular exercise, not using drugs or products that contain nicotine and tobacco, and limiting alcohol use. What can I expect for my preventive care visit? Physical exam Your health care provider will check your:  Height and weight. This may be used to calculate body mass index (BMI), which tells if you are at a healthy weight.  Heart rate and blood pressure.  Skin for abnormal spots. Counseling Your health care provider may ask you questions about your:  Alcohol, tobacco, and drug use.  Emotional well-being.  Home and relationship well-being.  Sexual activity.  Eating habits.  Work and work Statistician.  Method of birth control.  Menstrual cycle.  Pregnancy history. What immunizations do I need?  Influenza (flu) vaccine  This is recommended every year. Tetanus, diphtheria, and pertussis (Tdap) vaccine  You may need a Td booster every 10 years. Varicella (chickenpox) vaccine  You may need this if you have not been vaccinated. Zoster (shingles) vaccine  You may need this after age 72. Measles, mumps, and rubella (MMR) vaccine  You may need at least  one dose of MMR if you were born in 1957 or later. You may also need a second dose. Pneumococcal conjugate (PCV13) vaccine  You may need this if you have certain conditions and were not previously vaccinated. Pneumococcal polysaccharide (PPSV23) vaccine  You may need one or two doses if you smoke cigarettes or if you have certain conditions. Meningococcal conjugate (MenACWY) vaccine  You may need this if you have certain conditions. Hepatitis A vaccine  You may need this if you have certain conditions or if you travel or work in places where you may be exposed to hepatitis A. Hepatitis B vaccine  You may need this if you have certain conditions or if you travel or work in places where you may be exposed to hepatitis B. Haemophilus influenzae type b (Hib) vaccine  You may need this if you have certain conditions. Human papillomavirus (HPV) vaccine  If recommended by your health care provider, you may need three doses over 6 months. You may receive vaccines as individual doses or as more than one vaccine together in one shot (combination vaccines). Talk with your health care provider about the risks and benefits of combination vaccines. What tests do I need? Blood tests  Lipid and cholesterol levels. These may be checked every 5 years, or more frequently if you are over 74 years old.  Hepatitis C test.  Hepatitis B test. Screening  Lung cancer screening. You may have this screening every year starting at age 77 if you have a 30-pack-year history of smoking and currently smoke or have quit within the past 15 years.  Colorectal  cancer screening. All adults should have this screening starting at age 50 and continuing until age 75. Your health care provider may recommend screening at age 45 if you are at increased risk. You will have tests every 1-10 years, depending on your results and the type of screening test.  Diabetes screening. This is done by checking your blood sugar (glucose)  after you have not eaten for a while (fasting). You may have this done every 1-3 years.  Mammogram. This may be done every 1-2 years. Talk with your health care provider about when you should start having regular mammograms. This may depend on whether you have a family history of breast cancer.  BRCA-related cancer screening. This may be done if you have a family history of breast, ovarian, tubal, or peritoneal cancers.  Pelvic exam and Pap test. This may be done every 3 years starting at age 21. Starting at age 30, this may be done every 5 years if you have a Pap test in combination with an HPV test. Other tests  Sexually transmitted disease (STD) testing.  Bone density scan. This is done to screen for osteoporosis. You may have this scan if you are at high risk for osteoporosis. Follow these instructions at home: Eating and drinking  Eat a diet that includes fresh fruits and vegetables, whole grains, lean protein, and low-fat dairy.  Take vitamin and mineral supplements as recommended by your health care provider.  Do not drink alcohol if: ? Your health care provider tells you not to drink. ? You are pregnant, may be pregnant, or are planning to become pregnant.  If you drink alcohol: ? Limit how much you have to 0-1 drink a day. ? Be aware of how much alcohol is in your drink. In the [place] one drink equals one 12 oz bottle of beer (355 mL), one 5 oz glass of wine (148 mL), or one 1 oz glass of hard liquor (44 mL). Lifestyle  Take daily care of your teeth and gums.  Stay active. Exercise for at least 30 minutes on 5 or more days each week.  Do not use any products that contain nicotine or tobacco, such as cigarettes, e-cigarettes, and chewing tobacco. If you need help quitting, ask your health care provider.  If you are sexually active, practice safe sex. Use a condom or other form of birth control (contraception) in order to prevent pregnancy and STIs (sexually transmitted  infections).  If told by your health care provider, take low-dose aspirin daily starting at age 50. What's next?  Visit your health care provider once a year for a well check visit.  Ask your health care provider how often you should have your eyes and teeth checked.  Stay up to date on all vaccines. This information is not intended to replace advice given to you by your health care provider. Make sure you discuss any questions you have with your health care provider. Document Released: 01/13/2016 Document Revised: 08/28/2018 Document Reviewed: 08/28/2018 Elsevier Patient Education  2020 [employer]vier Inc[employer]   

## 2019-08-04 NOTE — Progress Notes (Signed)
Subjective  Chief Complaint  Patient presents with  . Annual Exam    She is fasting, wants thyroid checked thinks it is getting larger    HPI: Wanda Gross is a 54 y.o. female who presents to [facility] [place] today for a Female Wellness Visit.   Wellness Visit: annual visit with health maintenance review and exam without Pap   Health maintenance: Doing well.  Due for mammogram and patient to schedule.  Pap smear up-to-date.  Colon cancer screening up-to-date.  She sees a gynecologist for female wellness exams.  Lifestyle: Continues to walk regularly.  Diet is fair, she has lost some weight.  Trying to eat healthier.  Has been working remotely but just went back to the office.  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  She is coping well.  She has had some somatic complaints including GERD, chest pain, fatigue and insomnia.  She is working on Building surveyor.  She knows we are related to work-related stress.  No asthma complications.  Review of systems positive for mild fullness in the neck.  Both sister and father had thyroidectomy is due to goiters.  No familial history of thyroid cancer.  She does not feel any nodules in her neck nor does she have tenderness.  Assessment  1. Annual physical exam   2. Mild intermittent asthma without complication   3. Vitamin D deficiency      Plan  Female Wellness Visit:  Age appropriate Health Maintenance and Prevention measures were discussed with patient. Included topics are cancer screening recommendations, ways to keep healthy (see AVS) including dietary and exercise recommendations, regular eye and dental care, use of seat belts, and avoidance of moderate alcohol use and tobacco use.  Patient to schedule her mammogram.  BMI: discussed patient's BMI and encouraged positive lifestyle modifications to help get to or maintain a target BMI.   HM needs and immunizations were addressed and ordered. See below for orders. See HM and immunization section for updates.  Routine labs and screening tests ordered including cmp, cbc and lipids where appropriate.  Check thyroid levels.  Monitor for goiter.  Ultrasound if seems to be growing.  Normal exam today.  Discussed recommendations regarding Vit D and calcium supplementation (see AVS)  Follow up: Return in about 1 year (around 08/03/2020) for complete physical.   Orders Placed This Encounter  Procedures  . CBC with Differential/Platelet  . Comprehensive metabolic panel  . Lipid panel  . TSH  . VITAMIN D 25 Hydroxy (Vit-D Deficiency, Fractures)   No orders of the defined types were placed in this encounter.    Lifestyle: Body mass index is 34.55 kg/m. Wt Readings from Last 3 Encounters:  08/04/19 220 lb 9.6 oz (100.1 kg)  02/16/19 226 lb 9.6 oz (102.8 kg)  12/11/18 227 lb (103 kg)    Patient Active Problem List   Diagnosis Date Noted  . Dysmenorrhea 03/21/2018  . Seasonal allergic rhinitis due to pollen 03/21/2018  . Obesity with body mass index 30 or greater 02/07/2018  . Vitamin D deficiency 02/07/2018  . Mild intermittent asthma 02/07/2018   Health Maintenance  Topic Date Due  . MAMMOGRAM  05/14/2019  . INFLUENZA VACCINE  08/01/2019  . PAP SMEAR-Modifier  04/19/2020  . TETANUS/TDAP  08/01/2023  . COLONOSCOPY  11/19/2024  . Hepatitis C Screening  Completed  . HIV Screening  Completed  Immunization History  Administered Date(s) Administered  . Influenza,inj,Quad PF,6+ Mos 12/11/2018  . Influenza-Unspecified 10/22/2017   We updated and reviewed the patient's past history in detail and it is documented below. Allergies: Patient has No Known Allergies. Past Medical History Patient  has a past medical history of Allergy, Asthma, Fibroid, and Medical history non-contributory. Past Surgical History Patient  has a past surgical history that includes Wisdom  tooth extraction and Dilatation & currettage/hysteroscopy with novasure ablation (N/A, 07/24/2013). Family History: Patient family history includes Alcohol abuse in her paternal grandfather; Breast cancer in her maternal aunt and paternal aunt; COPD in her mother; Congestive Heart Failure in her father; Healthy in her brother and daughter; Hypertension in her father and mother; Hypothyroidism in her father and sister; Lung cancer in her maternal grandmother; Ovarian cancer in her paternal grandmother. Social History:  Patient  reports that she has never smoked. She has never used smokeless tobacco. She reports current alcohol use. She reports that she does not use drugs.  Review of Systems: Constitutional: negative for fever or malaise Ophthalmic: negative for photophobia, double vision or loss of vision Cardiovascular: negative for chest pain, dyspnea on exertion, or new LE swelling Respiratory: negative for SOB or persistent cough Gastrointestinal: negative for abdominal pain, change in bowel habits or melena Genitourinary: negative for dysuria or gross hematuria, no abnormal uterine bleeding or disharge Musculoskeletal: negative for new gait disturbance or muscular weakness Integumentary: negative for new or persistent rashes, no breast lumps Neurological: negative for TIA or stroke symptoms Psychiatric: negative for SI or delusions Allergic/Immunologic: negative for hives  Patient Care Team    Relationship Specialty Notifications Start End  Leamon Arnt, MD PCP - General Family Medicine  03/21/18   Everett Graff, MD Consulting Physician Obstetrics and Gynecology  03/21/18     Objective  Vitals: BP 122/76   Pulse 67   Temp 98.1 F (36.7 C) (Oral)   Resp 16   Ht 5\' 7"  (1.702 m)   Wt 220 lb 9.6 oz (100.1 kg)   SpO2 97%   BMI 34.55 kg/m  General:  Well developed, well nourished, no acute distress  Psych:  Alert and orientedx3,normal mood and affect HEENT:  Normocephalic,  atraumatic, non-icteric sclera, PERRL, oropharynx is clear without mass or exudate, supple neck without adenopathy, mass or thyromegaly Cardiovascular:  Normal S1, S2, RRR without gallop, rub or murmur, nondisplaced PMI Respiratory:  Good breath sounds bilaterally, CTAB with normal respiratory effort Gastrointestinal: normal bowel sounds, soft, non-tender, no noted masses. No HSM MSK: no deformities, contusions. Joints are without erythema or swelling. Spine and CVA region are nontender Skin:  Warm, no rashes or suspicious lesions noted Neurologic:    Mental status is normal. CN 2-11 are normal. Gross motor and sensory exams are normal. Normal gait. No tremor   Commons side effects, risks, benefits, and alternatives for medications and treatment plan prescribed today were discussed, and the patient expressed understanding of the given instructions. Patient is instructed to call or message via MyChart if he/she has any questions or concerns regarding our treatment plan. No barriers to understanding were identified. We discussed Red Flag symptoms and signs in detail. Patient expressed understanding regarding what to do in case of urgent or emergency type symptoms.   Medication list was reconciled, printed and provided to the patient in AVS. Patient instructions and summary information was reviewed with the patient as documented in the AVS. This note was prepared with assistance of Dragon voice recognition software. Occasional  wrong-word or sound-a-like substitutions may have occurred due to the inherent limitations of voice recognition software

## 2019-08-05 MED ORDER — VITAMIN D (ERGOCALCIFEROL) 1.25 MG (50000 UNIT) PO CAPS: 50000.0000 [IU] | ORAL_CAPSULE | ORAL | 0 refills | Status: DC

## 2019-08-05 NOTE — Addendum Note (Signed)
Addended by: Billey Chang on: 08/05/2019 09:24 AM   Modules accepted: Orders

## 2019-08-07 ENCOUNTER — Other Ambulatory Visit: Payer: Self-pay

## 2019-08-07 ENCOUNTER — Ambulatory Visit
Admission: RE | Admit: 2019-08-07 | Discharge: 2019-08-07 | Disposition: A | Discharge status: Home / Self Care | Payer: BC Managed Care – PPO | Source: Ambulatory Visit | Attending: Obstetrics and Gynecology | Admitting: Obstetrics and Gynecology

## 2019-08-07 DIAGNOSIS — Z1231 Encounter for screening mammogram for malignant neoplasm of breast: Secondary | ICD-10-CM | POA: Diagnosis not present

## 2019-08-24 ENCOUNTER — Encounter: Payer: Self-pay | Admitting: Family Medicine

## 2019-08-24 ENCOUNTER — Ambulatory Visit (INDEPENDENT_AMBULATORY_CARE_PROVIDER_SITE_OTHER): Payer: BC Managed Care – PPO | Admitting: Family Medicine

## 2019-08-24 VITALS — BP 129/86

## 2019-08-24 DIAGNOSIS — G44209 Tension-type headache, unspecified, not intractable: Secondary | ICD-10-CM | POA: Diagnosis not present

## 2019-08-24 DIAGNOSIS — R0789 Other chest pain: Secondary | ICD-10-CM | POA: Diagnosis not present

## 2019-08-24 DIAGNOSIS — F430 Acute stress reaction: Secondary | ICD-10-CM

## 2019-08-24 DIAGNOSIS — F70 Mild intellectual disabilities: Secondary | ICD-10-CM

## 2019-08-24 NOTE — Progress Notes (Signed)
Virtual Visit via Video Note  Subjective  CC:  Chief Complaint  Patient presents with  . Numbness    Started Friday, Right side.. Reports chest pressure, numbness started at the top of arm and radiated down to hand.. Also reports some nausea     I connected with MAHEEN DEDIOS on 08/24/19 at  4:20 PM EDT by a video enabled telemedicine application and verified that I am speaking with the correct person using two identifiers. Location patient: Home Location provider: [facility] at House, Office Persons participating in the virtual visit: [name], Leamon Arnt, MD Lilli Light, [place] discussed the limitations of evaluation and management by telemedicine and the availability of in person appointments. The patient expressed understanding and agreed to proceed. HPI: Wanda Gross is a 54 y.o. female who was contacted today to address the problems listed above in the chief complaint. . 54 yo female with increased stressors at work over the last several months now with sxs of right headache, and feeling "one step behind". Went to work 3 days ago and felt like she struggled to get through her emails and workload. Felt 'mentally slow'. Over the weekend, did less than usual and today with headache, numbness at right shoulder and some tingling in right hand w/o weakness. C/o feeling tired (not sleeping well) and some chest pressure that has been on and off over the last several months. No DOE, exertional cp, LE edema, facial droop, speech changes, visual changes. No h/o migraines. Knows she is stressed. No h/o panic attacks. No CV RF except for obesity. Recent lipids and labs were all normal.   Assessment  1. Stress reaction   2. Tension headache   3. Chest pressure   4. Mild mental slowing      Plan   Possible stress related:  Education and counseling done. Discussed difficulty diagnosing by video visit. Doubt ACS, Cardiac or pulmonary problem, stroke;  atypical migraine is in differential as well. Will monitor and pt will work on stress mgt and sleep. IF sxs worsen, to ER. If persist, needs OV for exam and EKG. Start baby aspirin in the interim.  I discussed the assessment and treatment plan with the patient. The patient was provided an opportunity to ask questions and all were answered. The patient agreed with the plan and demonstrated an understanding of the instructions.   The patient was advised to call back or seek an in-person evaluation if the symptoms worsen or if the condition fails to improve as anticipated. Follow up: prn  Visit date not found  No orders of the defined types were placed in this encounter.     I reviewed the patients updated PMH, FH, and SocHx.    Patient Active Problem List   Diagnosis Date Noted  . Dysmenorrhea 03/21/2018  . Seasonal allergic rhinitis due to pollen 03/21/2018  . Obesity with body mass index 30 or greater 02/07/2018  . Vitamin D deficiency 02/07/2018  . Mild intermittent asthma 02/07/2018   Current Meds  Medication Sig  . albuterol (PROVENTIL HFA;VENTOLIN HFA) 108 (90 Base) MCG/ACT inhaler Inhale 2 puffs into the lungs every 6 (six) hours as needed for wheezing or shortness of breath.  . beclomethasone (QVAR REDIHALER) 80 MCG/ACT inhaler Inhale 1 puff into the lungs 2 (two) times daily.  . fexofenadine (ALLEGRA) 180 MG tablet Take 1 tablet (180 mg total) by mouth daily.  . fluticasone (FLONASE) 50 MCG/ACT nasal  spray 1 spray nasally twice daily until symptoms resolve    Allergies: Patient has No Known Allergies. Family History: Patient family history includes Alcohol abuse in her paternal grandfather; Breast cancer in her maternal aunt and paternal aunt; COPD in her mother; Congestive Heart Failure in her father; Healthy in her brother and daughter; Hypertension in her father and mother; Hypothyroidism in her father and sister; Lung cancer in her maternal grandmother; Ovarian cancer in  her paternal grandmother. Social History:  Patient  reports that she has never smoked. She has never used smokeless tobacco. She reports current alcohol use. She reports that she does not use drugs.  Review of Systems: Constitutional: Negative for fever malaise or anorexia Cardiovascular: negative for chest pain Respiratory: negative for SOB or persistent cough Gastrointestinal: negative for abdominal pain  OBJECTIVE Vitals: BP 129/86  General: no acute distress , A&Ox3  No visits with results within 1 Day(s) from this visit.  Latest known visit with results is:  Office Visit on 08/04/2019  Component Date Value Ref Range Status  . WBC 08/04/2019 6.6  4.0 - 10.5 K/uL Final  . RBC 08/04/2019 4.49  3.87 - 5.11 Mil/uL Final  . Hemoglobin 08/04/2019 12.4  12.0 - 15.0 g/dL Final  . HCT 08/04/2019 38.7  36.0 - 46.0 % Final  . MCV 08/04/2019 86.3  78.0 - 100.0 fl Final  . MCHC 08/04/2019 32.1  30.0 - 36.0 g/dL Final  . RDW 08/04/2019 14.9  11.5 - 15.5 % Final  . Platelets 08/04/2019 236.0  150.0 - 400.0 K/uL Final  . Neutrophils Relative % 08/04/2019 61.2  43.0 - 77.0 % Final  . Lymphocytes Relative 08/04/2019 28.1  12.0 - 46.0 % Final  . Monocytes Relative 08/04/2019 8.7  3.0 - 12.0 % Final  . Eosinophils Relative 08/04/2019 1.2  0.0 - 5.0 % Final  . Basophils Relative 08/04/2019 0.8  0.0 - 3.0 % Final  . Neutro Abs 08/04/2019 4.0  1.4 - 7.7 K/uL Final  . Lymphs Abs 08/04/2019 1.9  0.7 - 4.0 K/uL Final  . Monocytes Absolute 08/04/2019 0.6  0.1 - 1.0 K/uL Final  . Eosinophils Absolute 08/04/2019 0.1  0.0 - 0.7 K/uL Final  . Basophils Absolute 08/04/2019 0.1  0.0 - 0.1 K/uL Final  . Sodium 08/04/2019 141  135 - 145 mEq/L Final  . Potassium 08/04/2019 3.9  3.5 - 5.1 mEq/L Final  . Chloride 08/04/2019 106  96 - 112 mEq/L Final  . CO2 08/04/2019 28  19 - 32 mEq/L Final  . Glucose, Bld 08/04/2019 90  70 - 99 mg/dL Final  . BUN 08/04/2019 15  6 - 23 mg/dL Final  . Creatinine, Ser  08/04/2019 0.77  0.40 - 1.20 mg/dL Final  . Total Bilirubin 08/04/2019 0.6  0.2 - 1.2 mg/dL Final  . Alkaline Phosphatase 08/04/2019 88  39 - 117 U/L Final  . AST 08/04/2019 15  0 - 37 U/L Final  . ALT 08/04/2019 18  0 - 35 U/L Final  . Total Protein 08/04/2019 7.4  6.0 - 8.3 g/dL Final  . Albumin 08/04/2019 4.3  3.5 - 5.2 g/dL Final  . Calcium 08/04/2019 9.3  8.4 - 10.5 mg/dL Final  . GFR 08/04/2019 94.18  >60.00 mL/min Final  . Cholesterol 08/04/2019 157  0 - 200 mg/dL Final  . Triglycerides 08/04/2019 82.0  0.0 - 149.0 mg/dL Final  . HDL 08/04/2019 58.20  >39.00 mg/dL Final  . VLDL 08/04/2019 16.4  0.0 - 40.0 mg/dL Final  .  LDL Cholesterol 08/04/2019 82  0 - 99 mg/dL Final  . Total CHOL/HDL Ratio 08/04/2019 3   Final  . NonHDL 08/04/2019 98.67   Final  . TSH 08/04/2019 1.54  0.35 - 4.50 uIU/mL Final  . VITD 08/04/2019 22.39* 30.00 - 100.00 ng/mL Final    Leamon Arnt, MD

## 2019-08-25 MED ORDER — TRAZODONE HCL 50 MG PO TABS: 25.0000 mg | ORAL_TABLET | Freq: Every evening | ORAL | 3 refills | Status: DC | PRN

## 2019-09-03 ENCOUNTER — Encounter: Payer: Self-pay | Admitting: Family Medicine

## 2019-09-03 ENCOUNTER — Encounter: Payer: Self-pay | Admitting: *Deleted

## 2019-09-03 ENCOUNTER — Ambulatory Visit (INDEPENDENT_AMBULATORY_CARE_PROVIDER_SITE_OTHER): Payer: BC Managed Care – PPO | Admitting: Family Medicine

## 2019-09-03 DIAGNOSIS — Z0279 Encounter for issue of other medical certificate: Secondary | ICD-10-CM

## 2019-09-03 DIAGNOSIS — G44209 Tension-type headache, unspecified, not intractable: Secondary | ICD-10-CM

## 2019-09-03 DIAGNOSIS — R0789 Other chest pain: Secondary | ICD-10-CM

## 2019-09-03 DIAGNOSIS — F5104 Psychophysiologic insomnia: Secondary | ICD-10-CM

## 2019-09-03 DIAGNOSIS — F430 Acute stress reaction: Secondary | ICD-10-CM

## 2019-09-03 NOTE — Progress Notes (Signed)
Virtual Visit via Video Note  Subjective  CC:  Chief Complaint  Patient presents with  . Stress    Reports that sxs are improving but still present.. Wants to have FMLA completed so she can have time off.     I connected with Wanda Gross on 09/03/19 at  4:20 PM EDT by a video enabled telemedicine application and verified that I am speaking with the correct person using two identifiers. Location patient: Home Location provider: [facility] [place], Office Persons participating in the virtual visit: Wanda Gross, Leamon Arnt, MD Lilli Light, Elliott discussed the limitations of evaluation and management by telemedicine and the availability of in person appointments. The patient expressed understanding and agreed to proceed. HPI: Wanda Gross is a 54 y.o. female who was contacted today to address the problems listed above in the chief complaint. . See last note. Doing better but still feeling exhausted, stressed, and some heaviness in right arm. No weakness. No further chest tightness. No sob. Sleep is improved on trazadone so feeling better but having difficulty with work due to these stress related sxs; requesting fmla. See forms. Reports having a hard time concentrating at work due these issues.  . No diplopia, dysarthria, pleuritic cp, n/v, diaphoresis, headache.  Assessment  1. Stress reaction   2. Chest pressure   3. Tension headache   4. Psychophysiological insomnia      Plan   Stress reaction :  Completed forms for fmla 4-6 weeks. Continue sleep meds. Monitor mood. Recheck 4-6 weeks to start mood meds if not improving.   Right arm sxs with h/o chest pressure: could all be due to above but rec in person ov for further evaluation. To ER for any new or worsening cp or neuro sxs. Pt agrees.   I discussed the assessment and treatment plan with the patient. The patient was provided an opportunity to ask questions and all were answered. The  patient agreed with the plan and demonstrated an understanding of the instructions.   The patient was advised to call back or seek an in-person evaluation if the symptoms worsen or if the condition fails to improve as anticipated. Follow up: in office next week for recheck.   Visit date not found  No orders of the defined types were placed in this encounter.     I reviewed the patients updated PMH, FH, and SocHx.    Patient Active Problem List   Diagnosis Date Noted  . Dysmenorrhea 03/21/2018  . Seasonal allergic rhinitis due to pollen 03/21/2018  . Obesity with body mass index 30 or greater 02/07/2018  . Vitamin D deficiency 02/07/2018  . Mild intermittent asthma 02/07/2018   Current Meds  Medication Sig  . albuterol (PROVENTIL HFA;VENTOLIN HFA) 108 (90 Base) MCG/ACT inhaler Inhale 2 puffs into the lungs every 6 (six) hours as needed for wheezing or shortness of breath.  . beclomethasone (QVAR REDIHALER) 80 MCG/ACT inhaler Inhale 1 puff into the lungs 2 (two) times daily.  . fexofenadine (ALLEGRA) 180 MG tablet Take 1 tablet (180 mg total) by mouth daily.  . fluticasone (FLONASE) 50 MCG/ACT nasal spray 1 spray nasally twice daily until symptoms resolve  . ibuprofen (ADVIL,MOTRIN) 600 MG tablet ibuprofen 600 mg tablet  . traZODone (DESYREL) 50 MG tablet Take 0.5-1 tablets (25-50 mg total) by mouth at bedtime as needed for sleep.  . Vitamin D, Ergocalciferol, (DRISDOL) 1.25 MG (50000 UT) CAPS capsule  Take 1 capsule (50,000 Units total) by mouth once a week.    Allergies: Patient has No Known Allergies. Family History: Patient family history includes Alcohol abuse in her paternal grandfather; Breast cancer in her maternal aunt and paternal aunt; COPD in her mother; Congestive Heart Failure in her father; Healthy in her brother and daughter; Hypertension in her father and mother; Hypothyroidism in her father and sister; Lung cancer in her maternal grandmother; Ovarian cancer in her  paternal grandmother. Social History:  Patient  reports that she has never smoked. She has never used smokeless tobacco. She reports current alcohol use. She reports that she does not use drugs.  Review of Systems: Constitutional: Negative for fever malaise or anorexia Cardiovascular: negative for chest pain Respiratory: negative for SOB or persistent cough Gastrointestinal: negative for abdominal pain  OBJECTIVE Vitals: There were no vitals taken for this visit. General: no acute distress , A&Ox3  Leamon Arnt, MD

## 2019-09-08 ENCOUNTER — Other Ambulatory Visit: Payer: Self-pay

## 2019-09-08 ENCOUNTER — Ambulatory Visit: Payer: BC Managed Care – PPO | Admitting: Family Medicine

## 2019-09-08 ENCOUNTER — Encounter: Payer: Self-pay | Admitting: Family Medicine

## 2019-09-08 ENCOUNTER — Ambulatory Visit (HOSPITAL_COMMUNITY)
Admission: RE | Admit: 2019-09-08 | Discharge: 2019-09-08 | Disposition: A | Discharge status: Home / Self Care | Payer: BC Managed Care – PPO | Source: Ambulatory Visit | Attending: Family Medicine | Admitting: Family Medicine

## 2019-09-08 VITALS — BP 124/78 | HR 78 | Temp 97.6°F | Resp 16 | Ht 67.0 in | Wt 221.8 lb

## 2019-09-08 DIAGNOSIS — R0789 Other chest pain: Secondary | ICD-10-CM

## 2019-09-08 DIAGNOSIS — R29898 Other symptoms and signs involving the musculoskeletal system: Secondary | ICD-10-CM

## 2019-09-08 DIAGNOSIS — R29818 Other symptoms and signs involving the nervous system: Secondary | ICD-10-CM | POA: Diagnosis not present

## 2019-09-08 DIAGNOSIS — R531 Weakness: Secondary | ICD-10-CM | POA: Diagnosis not present

## 2019-09-08 MED ORDER — GADOBUTROL 1 MMOL/ML IV SOLN
10.0000 mL | Freq: Once | INTRAVENOUS | Status: AC | PRN
  Administered 2019-09-08: 10 mL via INTRAVENOUS

## 2019-09-08 NOTE — Patient Instructions (Signed)
Please go and get the brain MRI as scheduled. I will let you know the results once they return. In the meantime, continue your aspirin daily and if any NEW headaches or neurologic symptoms occur, go directly to the emergency room.

## 2019-09-08 NOTE — Progress Notes (Signed)
Subjective  CC:  Chief Complaint  Patient presents with  . Stress    Still having right arm numbness    HPI: Wanda Gross is a 54 y.o. female who presents to the office today to address the problems listed above in the chief complaint.  Please refer to last notes. Started with acute onset of feeling "one step behind" and right arm numbness, heaviness and right sided pressure associated with a headache. Most things have improved but right arm/hand sxs persist. No tingling but feels heavy and sluggish. Sits with it at her side, using it less. She is right handed. Speech is clear but still having some word finding issues. No dyarthria, diplopia, or true pareses, ataxia. Stressed at work and Scientist, clinical (histocompatibility and immunogenetics). On asa daily now. No new sxs. No CVR factors outside of obesity    Assessment  1. Chest pressure   2. Other symptoms and signs involving the nervous system   3. Right arm weakness      Plan   Right hand weakness and right arm neuro sxs. :  Concerning for CVA: check brain MRI today.   ekg is normal; doubt any cardiopulmonary pathology  Stress: working on stress reduction and sleep   Follow up: pending mri  Visit date not found  Orders Placed This Encounter  Procedures  . MR Brain W Wo Contrast  . EKG 12-Lead   No orders of the defined types were placed in this encounter.     I reviewed the patients updated PMH, FH, and SocHx.    Patient Active Problem List   Diagnosis Date Noted  . Dysmenorrhea 03/21/2018  . Seasonal allergic rhinitis due to pollen 03/21/2018  . Obesity with body mass index 30 or greater 02/07/2018  . Vitamin D deficiency 02/07/2018  . Mild intermittent asthma 02/07/2018   Current Meds  Medication Sig  . albuterol (PROVENTIL HFA;VENTOLIN HFA) 108 (90 Base) MCG/ACT inhaler Inhale 2 puffs into the lungs every 6 (six) hours as needed for wheezing or shortness of breath.  . beclomethasone (QVAR REDIHALER) 80 MCG/ACT inhaler Inhale 1 puff into the  lungs 2 (two) times daily.  . fexofenadine (ALLEGRA) 180 MG tablet Take 1 tablet (180 mg total) by mouth daily.  . fluticasone (FLONASE) 50 MCG/ACT nasal spray 1 spray nasally twice daily until symptoms resolve  . ibuprofen (ADVIL,MOTRIN) 600 MG tablet ibuprofen 600 mg tablet  . traZODone (DESYREL) 50 MG tablet Take 0.5-1 tablets (25-50 mg total) by mouth at bedtime as needed for sleep.  . Vitamin D, Ergocalciferol, (DRISDOL) 1.25 MG (50000 UT) CAPS capsule Take 1 capsule (50,000 Units total) by mouth once a week.    Allergies: Patient has No Known Allergies. Family History: Patient family history includes Alcohol abuse in her paternal grandfather; Breast cancer in her maternal aunt and paternal aunt; COPD in her mother; Congestive Heart Failure in her father; Healthy in her brother and daughter; Hypertension in her father and mother; Hypothyroidism in her father and sister; Lung cancer in her maternal grandmother; Ovarian cancer in her paternal grandmother. Social History:  Patient  reports that she has never smoked. She has never used smokeless tobacco. She reports current alcohol use. She reports that she does not use drugs.  Review of Systems: Constitutional: Negative for fever malaise or anorexia Cardiovascular: negative for chest pain Respiratory: negative for SOB or persistent cough Gastrointestinal: negative for abdominal pain MSK: no neck pain, no chest wall pain.    Objective  Vitals: BP 124/78  Pulse 78   Temp 97.6 F (36.4 C) (Tympanic)   Resp 16   Ht 5\' 7"  (1.702 m)   Wt 221 lb 12.8 oz (100.6 kg)   SpO2 98%   BMI 34.74 kg/m  General: no acute distress , A&Ox3 Psych: normal affect, good insight HEENT: PEERLA, conjunctiva normal, Oropharynx moist,neck is supple Cardiovascular:  RRR without murmur or gallop. No chest wall ttp Respiratory:  Good breath sounds bilaterally, CTAB with normal respiratory effort Skin:  Warm, no rashes Neuro:  Physical Exam Neurological:      Mental Status: She is alert and oriented to person, place, and time. Mental status is at baseline.     Cranial Nerves: Cranial nerves are intact.     Sensory: Sensation is intact.     Motor: Weakness (right hand) present. No tremor, atrophy, abnormal muscle tone or pronator drift.     Coordination: Coordination is intact.     Gait: Gait is intact.     Deep Tendon Reflexes: Reflexes are normal and symmetric.   negative spurling's test, normal speech, negative phalen's, normal gait.  EKG: NSR, nl ekg   Commons side effects, risks, benefits, and alternatives for medications and treatment plan prescribed today were discussed, and the patient expressed understanding of the given instructions. Patient is instructed to call or message via MyChart if he/she has any questions or concerns regarding our treatment plan. No barriers to understanding were identified. We discussed Red Flag symptoms and signs in detail. Patient expressed understanding regarding what to do in case of urgent or emergency type symptoms.   Medication list was reconciled, printed and provided to the patient in AVS. Patient instructions and summary information was reviewed with the patient as documented in the AVS. This note was prepared with assistance of Dragon voice recognition software. Occasional wrong-word or sound-a-like substitutions may have occurred due to the inherent limitations of voice recognition software

## 2019-09-09 MED ORDER — PREDNISONE 10 MG PO TABS: ORAL_TABLET | ORAL | 0 refills | Status: DC

## 2019-09-09 NOTE — Addendum Note (Signed)
Addended by: Billey Chang on: 09/09/2019 08:15 AM   Modules accepted: Orders

## 2019-09-14 ENCOUNTER — Ambulatory Visit (HOSPITAL_COMMUNITY): Payer: BC Managed Care – PPO

## 2019-10-01 ENCOUNTER — Encounter: Payer: Self-pay | Admitting: *Deleted

## 2019-10-01 ENCOUNTER — Encounter: Payer: Self-pay | Admitting: Family Medicine

## 2019-10-02 ENCOUNTER — Encounter: Payer: Self-pay | Admitting: Family Medicine

## 2019-12-17 ENCOUNTER — Other Ambulatory Visit: Payer: Self-pay | Admitting: Family Medicine

## 2020-02-05 ENCOUNTER — Encounter: Payer: Self-pay | Admitting: Family Medicine

## 2020-02-22 ENCOUNTER — Ambulatory Visit: Payer: Self-pay | Attending: Family

## 2020-02-22 DIAGNOSIS — Z23 Encounter for immunization: Secondary | ICD-10-CM | POA: Insufficient documentation

## 2020-02-22 NOTE — Progress Notes (Signed)
   Covid-19 Vaccination Clinic  Name:  Wanda Gross    MRN: [id number] DOB: August 24, 1965  02/22/2020  [name] was observed post Covid-19 immunization for 15 minutes without incidence. She was provided with Vaccine Information Sheet and instruction to access the V-Safe system.   [name] was instructed to call 911 with any severe reactions post vaccine: Marland Kitchen Difficulty breathing  . Swelling of your face and throat  . A fast heartbeat  . A bad rash all over your body  . Dizziness and weakness    Immunizations Administered    Name Date Dose VIS Date Route   Moderna COVID-19 Vaccine 02/22/2020 12:35 PM 0.5 mL 12/01/2019 Intramuscular   Manufacturer: Moderna   Lot: [id number]   [id number]

## 2020-03-02 ENCOUNTER — Encounter: Payer: Self-pay | Admitting: Family Medicine

## 2020-03-03 ENCOUNTER — Other Ambulatory Visit: Payer: Self-pay

## 2020-03-03 MED ORDER — QVAR REDIHALER 80 MCG/ACT IN AERB: 1.0000 | INHALATION_SPRAY | Freq: Two times a day (BID) | RESPIRATORY_TRACT | 3 refills | Status: DC

## 2020-03-03 MED ORDER — ALBUTEROL SULFATE HFA 108 (90 BASE) MCG/ACT IN AERS: 2.0000 | INHALATION_SPRAY | Freq: Four times a day (QID) | RESPIRATORY_TRACT | 3 refills | Status: DC | PRN

## 2020-03-29 ENCOUNTER — Ambulatory Visit: Payer: Self-pay | Attending: Family

## 2020-03-29 DIAGNOSIS — Z23 Encounter for immunization: Secondary | ICD-10-CM

## 2020-03-29 NOTE — Progress Notes (Signed)
   Covid-19 Vaccination Clinic  Name:  KILEY FOWLER    MRN: [id number] DOB: 1965/01/04  03/29/2020  Ms. Geitner was observed post Covid-19 immunization for 15 minutes without incident. She was provided with Vaccine Information Sheet and instruction to access the V-Safe system.   Ms. Cavalcante was instructed to call 911 with any severe reactions post vaccine: Marland Kitchen Difficulty breathing  . Swelling of face and throat  . A fast heartbeat  . A bad rash all over body  . Dizziness and weakness   Immunizations Administered    Name Date Dose VIS Date Route   Moderna COVID-19 Vaccine 03/29/2020 10:14 AM 0.5 mL 12/01/2019 Intramuscular   Manufacturer: Moderna   LotFP:[id number]   [id number]

## 2020-06-13 ENCOUNTER — Other Ambulatory Visit: Payer: Self-pay | Admitting: Family Medicine

## 2020-06-23 ENCOUNTER — Other Ambulatory Visit: Payer: Self-pay | Admitting: Obstetrics and Gynecology

## 2020-06-23 DIAGNOSIS — Z1231 Encounter for screening mammogram for malignant neoplasm of breast: Secondary | ICD-10-CM

## 2020-08-05 ENCOUNTER — Other Ambulatory Visit: Payer: Self-pay

## 2020-08-05 ENCOUNTER — Encounter: Payer: Self-pay | Admitting: Family Medicine

## 2020-08-05 ENCOUNTER — Ambulatory Visit (INDEPENDENT_AMBULATORY_CARE_PROVIDER_SITE_OTHER): Payer: BC Managed Care – PPO | Admitting: Family Medicine

## 2020-08-05 VITALS — BP 118/70 | HR 64 | Temp 97.7°F | Resp 15 | Ht 67.0 in | Wt 224.0 lb

## 2020-08-05 DIAGNOSIS — E559 Vitamin D deficiency, unspecified: Secondary | ICD-10-CM

## 2020-08-05 DIAGNOSIS — J301 Allergic rhinitis due to pollen: Secondary | ICD-10-CM

## 2020-08-05 DIAGNOSIS — Z0000 Encounter for general adult medical examination without abnormal findings: Secondary | ICD-10-CM

## 2020-08-05 DIAGNOSIS — J4520 Mild intermittent asthma, uncomplicated: Secondary | ICD-10-CM | POA: Diagnosis not present

## 2020-08-05 DIAGNOSIS — H1011 Acute atopic conjunctivitis, right eye: Secondary | ICD-10-CM

## 2020-08-05 NOTE — Progress Notes (Signed)
Subjective  Chief Complaint  Patient presents with  . Annual Exam    fasting     HPI: Wanda Gross is a 55 y.o. female who presents to [place] [facility] [place] today for a Female Wellness Visit. She also has the concerns and/or needs as listed above in the chief complaint. These will be addressed in addition to the Health Maintenance Visit.   Wellness Visit: annual visit with health maintenance review and exam without Pap   HM:  To see GYN for pap this year. mammo is scheduled. imms up to date. Doing great: quit job and feels a lot better emotionally and physically. Chronic disease f/u and/or acute problem visit: (deemed necessary to be done in addition to the wellness visit):  Mild intermittent asthma is well controlled with rare albuterol use.  Continues on inhaled steroid.  Trigger is high humidity and heat.  No recent infections or exacerbations  Seasonal allergies with mild itching and redness of right eye.  Assessment  1. Annual physical exam   2. Mild intermittent asthma without complication   3. Vitamin D deficiency   4. Seasonal allergic rhinitis due to pollen   5. Allergic conjunctivitis of right eye      Plan  Female Wellness Visit:  Age appropriate Health Maintenance and Prevention measures were discussed with patient. Included topics are cancer screening recommendations, ways to keep healthy (see AVS) including dietary and exercise recommendations, regular eye and dental care, use of seat belts, and avoidance of moderate alcohol use and tobacco use.  Patient to send me records of her Pap smear that is now due.  She will get this with her gynecologist.  Mammogram is scheduled.  BMI: discussed patient's BMI and encouraged positive lifestyle modifications to help get to or maintain a target BMI.  HM needs and immunizations were addressed and ordered. See below for orders. See HM and immunization section for updates.  Routine labs and screening tests  ordered including cmp, cbc and lipids where appropriate.  Discussed recommendations regarding Vit D and calcium supplementation (see AVS)  Chronic disease management visit and/or acute problem visit:  Mild intermittent asthma is well controlled.  Continue current medications  Start antihistamine eyedrop and continue allergy medicines Will recheck vitamin D levels.  Follow up: 12 months for complete physical Orders Placed This Encounter  Procedures  . CBC with Differential/Platelet  . Comprehensive metabolic panel  . Lipid panel  . TSH  . VITAMIN D 25 Hydroxy (Vit-D Deficiency, Fractures)   No orders of the defined types were placed in this encounter.     Lifestyle: Body mass index is 35.08 kg/m. Wt Readings from Last 3 Encounters:  08/05/20 224 lb (101.6 kg)  09/08/19 221 lb 12.8 oz (100.6 kg)  08/04/19 220 lb 9.6 oz (100.1 kg)   Diet: regular Exercise: several times per week, yoga, walking  Patient Active Problem List   Diagnosis Date Noted  . Dysmenorrhea 03/21/2018  . Seasonal allergic rhinitis due to pollen 03/21/2018  . Obesity with body mass index 30 or greater 02/07/2018  . Vitamin D deficiency 02/07/2018  . Mild intermittent asthma 02/07/2018   Health Maintenance  Topic Date Due  . INFLUENZA VACCINE  07/31/2020  . PAP SMEAR-Modifier  09/05/2020 (Originally 04/19/2020)  . MAMMOGRAM  08/06/2020  . TETANUS/TDAP  08/01/2023  . COLONOSCOPY  11/19/2024  . COVID-19 Vaccine  Completed  . Hepatitis C Screening  Completed  . HIV Screening  Completed   Immunization History  Administered Date(s) Administered  . Influenza,inj,Quad PF,6+ Mos 12/11/2018  . Influenza-Unspecified 10/22/2017  . Moderna SARS-COVID-2 Vaccination 02/22/2020, 03/29/2020   We updated and reviewed the patient's past history in detail and it is documented below. Allergies: Patient has No Known Allergies. Past Medical History Patient  has a past medical history of Allergy, Asthma,  Fibroid, and Medical history non-contributory. Past Surgical History Patient  has a past surgical history that includes Wisdom tooth extraction and Dilatation & currettage/hysteroscopy with novasure ablation (N/A, 07/24/2013). Family History: Patient family history includes Alcohol abuse in her paternal grandfather; Breast cancer in her maternal aunt and paternal aunt; COPD in her mother; Congestive Heart Failure in her father; Healthy in her brother and daughter; Hypertension in her father and mother; Hypothyroidism in her father and sister; Lung cancer in her maternal grandmother; Ovarian cancer in her paternal grandmother. Social History:  Patient  reports that she has never smoked. She has never used smokeless tobacco. She reports current alcohol use. She reports that she does not use drugs.  Review of Systems: Constitutional: negative for fever or malaise Ophthalmic: negative for photophobia, double vision or loss of vision Cardiovascular: negative for chest pain, dyspnea on exertion, or new LE swelling Respiratory: negative for SOB or persistent cough Gastrointestinal: negative for abdominal pain, change in bowel habits or melena Genitourinary: negative for dysuria or gross hematuria, no abnormal uterine bleeding or disharge Musculoskeletal: negative for new gait disturbance or muscular weakness Integumentary: negative for new or persistent rashes, no breast lumps Neurological: negative for TIA or stroke symptoms Psychiatric: negative for SI or delusions Allergic/Immunologic: negative for hives  Patient Care Team    Relationship Specialty Notifications Start End  Leamon Arnt, MD PCP - General Family Medicine  03/21/18   Everett Graff, MD Consulting Physician Obstetrics and Gynecology  03/21/18     Objective  Vitals: BP 118/70 Comment: left arm, cla  Pulse 64   Temp 97.7 F (36.5 C) (Temporal)   Resp 15   Ht 5\' 7"  (1.702 m)   Wt 224 lb (101.6 kg)   SpO2 98%   BMI 35.08  kg/m  General:  Well developed, well nourished, no acute distress  Psych:  Alert and orientedx3,normal mood and affect HEENT:  Normocephalic, atraumatic, non-icteric sclera,  supple neck without adenopathy, mass or thyromegaly Cardiovascular:  Normal S1, S2, RRR without gallop, rub or murmur Respiratory:  Good breath sounds bilaterally, CTAB with normal respiratory effort Gastrointestinal: normal bowel sounds, soft, non-tender, no noted masses. No HSM MSK: no deformities, contusions. Joints are without erythema or swelling.  Skin:  Warm, no rashes or suspicious lesions noted Neurologic:    Mental status is normal. CN 2-11 are normal. Gross motor and sensory exams are normal. Normal gait. No tremor    Commons side effects, risks, benefits, and alternatives for medications and treatment plan prescribed today were discussed, and the patient expressed understanding of the given instructions. Patient is instructed to call or message via MyChart if he/she has any questions or concerns regarding our treatment plan. No barriers to understanding were identified. We discussed Red Flag symptoms and signs in detail. Patient expressed understanding regarding what to do in case of urgent or emergency type symptoms.   Medication list was reconciled, printed and provided to the patient in AVS. Patient instructions and summary information was reviewed with the patient as documented in the AVS. This note was prepared with assistance of Dragon voice recognition software. Occasional wrong-word or sound-a-like substitutions may have occurred due  to the inherent limitations of voice recognition software  This visit occurred during the SARS-CoV-2 public health emergency.  Safety protocols were in place, including screening questions prior to the visit, additional usage of staff PPE, and extensive cleaning of exam room while observing appropriate contact time as indicated for disinfecting solutions.

## 2020-08-05 NOTE — Patient Instructions (Signed)
Please return in 12 months for your annual complete physical; please come fasting.  I will release your lab results to you on your MyChart account with further instructions. Please reply with any questions.    If you have any questions or concerns, please don't hesitate to send me a message via MyChart or call the office at [phone]. Thank you for visiting with Wanda Gross today! It's our pleasure caring for you.   Preventive Care 24-56 Years Old, Female Preventive care refers to visits with your health care provider and lifestyle choices that can promote health and wellness. This includes:  A yearly physical exam. This may also be called an annual well check.  Regular dental visits and eye exams.  Immunizations.  Screening for certain conditions.  Healthy lifestyle choices, such as eating a healthy diet, getting regular exercise, not using drugs or products that contain nicotine and tobacco, and limiting alcohol use. What can I expect for my preventive care visit? Physical exam Your health care provider will check your:  Height and weight. This may be used to calculate body mass index (BMI), which tells if you are at a healthy weight.  Heart rate and blood pressure.  Skin for abnormal spots. Counseling Your health care provider may ask you questions about your:  Alcohol, tobacco, and drug use.  Emotional well-being.  Home and relationship well-being.  Sexual activity.  Eating habits.  Work and work Statistician.  Method of birth control.  Menstrual cycle.  Pregnancy history. What immunizations do I need?  Influenza (flu) vaccine  This is recommended every year. Tetanus, diphtheria, and pertussis (Tdap) vaccine  You may need a Td booster every 10 years. Varicella (chickenpox) vaccine  You may need this if you have not been vaccinated. Zoster (shingles) vaccine  You may need this after age 72. Measles, mumps, and rubella (MMR) vaccine  You may need at least  one dose of MMR if you were born in 1957 or later. You may also need a second dose. Pneumococcal conjugate (PCV13) vaccine  You may need this if you have certain conditions and were not previously vaccinated. Pneumococcal polysaccharide (PPSV23) vaccine  You may need one or two doses if you smoke cigarettes or if you have certain conditions. Meningococcal conjugate (MenACWY) vaccine  You may need this if you have certain conditions. Hepatitis A vaccine  You may need this if you have certain conditions or if you travel or work in places where you may be exposed to hepatitis A. Hepatitis B vaccine  You may need this if you have certain conditions or if you travel or work in places where you may be exposed to hepatitis B. Haemophilus influenzae type b (Hib) vaccine  You may need this if you have certain conditions. Human papillomavirus (HPV) vaccine  If recommended by your health care provider, you may need three doses over 6 months. You may receive vaccines as individual doses or as more than one vaccine together in one shot (combination vaccines). Talk with your health care provider about the risks and benefits of combination vaccines. What tests do I need? Blood tests  Lipid and cholesterol levels. These may be checked every 5 years, or more frequently if you are over 74 years old.  Hepatitis C test.  Hepatitis B test. Screening  Lung cancer screening. You may have this screening every year starting at age 77 if you have a 30-pack-year history of smoking and currently smoke or have quit within the past 15 years.  Colorectal  cancer screening. All adults should have this screening starting at age 37 and continuing until age 48. Your health care provider may recommend screening at age 57 if you are at increased risk. You will have tests every 1-10 years, depending on your results and the type of screening test.  Diabetes screening. This is done by checking your blood sugar (glucose)  after you have not eaten for a while (fasting). You may have this done every 1-3 years.  Mammogram. This may be done every 1-2 years. Talk with your health care provider about when you should start having regular mammograms. This may depend on whether you have a family history of breast cancer.  BRCA-related cancer screening. This may be done if you have a family history of breast, ovarian, tubal, or peritoneal cancers.  Pelvic exam and Pap test. This may be done every 3 years starting at age 52. Starting at age 48, this may be done every 5 years if you have a Pap test in combination with an HPV test. Other tests  Sexually transmitted disease (STD) testing.  Bone density scan. This is done to screen for osteoporosis. You may have this scan if you are at high risk for osteoporosis. Follow these instructions at home: Eating and drinking  Eat a diet that includes fresh fruits and vegetables, whole grains, lean protein, and low-fat dairy.  Take vitamin and mineral supplements as recommended by your health care provider.  Do not drink alcohol if: ? Your health care provider tells you not to drink. ? You are pregnant, may be pregnant, or are planning to become pregnant.  If you drink alcohol: ? Limit how much you have to 0-1 drink a day. ? Be aware of how much alcohol is in your drink. In the [place] one drink equals one 12 oz bottle of beer (355 mL), one 5 oz glass of wine (148 mL), or one 1 oz glass of hard liquor (44 mL). Lifestyle  Take daily care of your teeth and gums.  Stay active. Exercise for at least 30 minutes on 5 or more days each week.  Do not use any products that contain nicotine or tobacco, such as cigarettes, e-cigarettes, and chewing tobacco. If you need help quitting, ask your health care provider.  If you are sexually active, practice safe sex. Use a condom or other form of birth control (contraception) in order to prevent pregnancy and STIs (sexually transmitted  infections).  If told by your health care provider, take low-dose aspirin daily starting at age 52. What's next?  Visit your health care provider once a year for a well check visit.  Ask your health care provider how often you should have your eyes and teeth checked.  Stay up to date on all vaccines. This information is not intended to replace advice given to you by your health care provider. Make sure you discuss any questions you have with your health care provider. Document Revised: 08/28/2018 Document Reviewed: 08/28/2018 Elsevier Patient Education  2020 [employer].

## 2020-08-06 LAB — CBC WITH DIFFERENTIAL/PLATELET
Absolute Monocytes: 418 cells/uL (ref 200–950)
Basophils Absolute: 20 cells/uL (ref 0–200)
Basophils Relative: 0.4 %
Eosinophils Absolute: 92 cells/uL (ref 15–500)
Eosinophils Relative: 1.8 %
HCT: 39.2 % (ref 35.0–45.0)
Hemoglobin: 12.6 g/dL (ref 11.7–15.5)
Lymphs Abs: 2004 cells/uL (ref 850–3900)
MCH: 27.3 pg (ref 27.0–33.0)
MCHC: 32.1 g/dL (ref 32.0–36.0)
MCV: 85 fL (ref 80.0–100.0)
MPV: 12.4 fL (ref 7.5–12.5)
Monocytes Relative: 8.2 %
Neutro Abs: 2565 cells/uL (ref 1500–7800)
Neutrophils Relative %: 50.3 %
Platelets: 259 10*3/uL (ref 140–400)
RBC: 4.61 10*6/uL (ref 3.80–5.10)
RDW: 13.4 % (ref 11.0–15.0)
Total Lymphocyte: 39.3 %
WBC: 5.1 10*3/uL (ref 3.8–10.8)

## 2020-08-06 LAB — LIPID PANEL
Cholesterol: 162 mg/dL (ref <200)
HDL: 65 mg/dL (ref >=50)
LDL Cholesterol (Calc): 80 mg/dL (calc)
Non-HDL Cholesterol (Calc): 97 mg/dL (calc) (ref <130)
Total CHOL/HDL Ratio: 2.5 (calc) (ref <5.0)
Triglycerides: 91 mg/dL (ref <150)

## 2020-08-06 LAB — COMPREHENSIVE METABOLIC PANEL
AG Ratio: 1.4 (calc) (ref 1.0–2.5)
ALT: 18 U/L (ref 6–29)
AST: 17 U/L (ref 10–35)
Albumin: 4.4 g/dL (ref 3.6–5.1)
Alkaline phosphatase (APISO): 90 U/L (ref 37–153)
BUN: 13 mg/dL (ref 7–25)
CO2: 24 mmol/L (ref 20–32)
Calcium: 9.4 mg/dL (ref 8.6–10.4)
Chloride: 103 mmol/L (ref 98–110)
Creat: 0.75 mg/dL (ref 0.50–1.05)
Globulin: 3.2 g/dL (calc) (ref 1.9–3.7)
Glucose, Bld: 82 mg/dL (ref 65–99)
Potassium: 4 mmol/L (ref 3.5–5.3)
Sodium: 138 mmol/L (ref 135–146)
Total Bilirubin: 0.6 mg/dL (ref 0.2–1.2)
Total Protein: 7.6 g/dL (ref 6.1–8.1)

## 2020-08-06 LAB — VITAMIN D 25 HYDROXY (VIT D DEFICIENCY, FRACTURES): Vit D, 25-Hydroxy: 14 ng/mL — ABNORMAL LOW (ref 30–100)

## 2020-08-06 LAB — TSH: TSH: 1.6 mIU/L

## 2020-08-08 ENCOUNTER — Ambulatory Visit
Admission: RE | Admit: 2020-08-08 | Discharge: 2020-08-08 | Disposition: A | Discharge status: Home / Self Care | Payer: BC Managed Care – PPO | Source: Ambulatory Visit | Attending: Obstetrics and Gynecology | Admitting: Obstetrics and Gynecology

## 2020-08-08 ENCOUNTER — Other Ambulatory Visit: Payer: Self-pay

## 2020-08-08 ENCOUNTER — Other Ambulatory Visit: Payer: Self-pay | Admitting: Family Medicine

## 2020-08-08 DIAGNOSIS — Z1231 Encounter for screening mammogram for malignant neoplasm of breast: Secondary | ICD-10-CM

## 2020-08-08 LAB — HM MAMMOGRAPHY

## 2020-08-08 MED ORDER — VITAMIN D (ERGOCALCIFEROL) 1.25 MG (50000 UNIT) PO CAPS: 50000.0000 [IU] | ORAL_CAPSULE | ORAL | 0 refills | Status: AC

## 2020-08-08 NOTE — Addendum Note (Signed)
Addended by: Billey Chang on: 08/08/2020 11:23 AM   Modules accepted: Orders

## 2020-08-12 ENCOUNTER — Encounter: Payer: BC Managed Care – PPO | Admitting: Family Medicine

## 2020-08-26 ENCOUNTER — Encounter: Payer: Self-pay | Admitting: Family Medicine

## 2020-09-26 ENCOUNTER — Encounter: Payer: Self-pay | Admitting: Family Medicine

## 2020-10-29 ENCOUNTER — Other Ambulatory Visit: Payer: Self-pay | Admitting: Family Medicine

## 2020-11-08 ENCOUNTER — Other Ambulatory Visit: Payer: Self-pay

## 2020-11-08 ENCOUNTER — Telehealth (INDEPENDENT_AMBULATORY_CARE_PROVIDER_SITE_OTHER): Payer: BC Managed Care – PPO | Admitting: Family Medicine

## 2020-11-08 DIAGNOSIS — R0981 Nasal congestion: Secondary | ICD-10-CM

## 2020-11-08 DIAGNOSIS — K0889 Other specified disorders of teeth and supporting structures: Secondary | ICD-10-CM | POA: Diagnosis not present

## 2020-11-08 DIAGNOSIS — B373 Candidiasis of vulva and vagina: Secondary | ICD-10-CM | POA: Diagnosis not present

## 2020-11-08 DIAGNOSIS — T7840XA Allergy, unspecified, initial encounter: Secondary | ICD-10-CM

## 2020-11-08 DIAGNOSIS — B3731 Acute candidiasis of vulva and vagina: Secondary | ICD-10-CM

## 2020-11-08 MED ORDER — AMOXICILLIN-POT CLAVULANATE 875-125 MG PO TABS: 1.0000 | ORAL_TABLET | Freq: Two times a day (BID) | ORAL | 0 refills | Status: DC

## 2020-11-08 MED ORDER — FLUCONAZOLE 150 MG PO TABS: 150.0000 mg | ORAL_TABLET | Freq: Once | ORAL | 0 refills | Status: AC

## 2020-11-08 NOTE — Patient Instructions (Signed)
-  I sent the medication(s) we discussed to your pharmacy: Meds ordered this encounter  Medications  . amoxicillin-clavulanate (AUGMENTIN) 875-125 MG tablet    Sig: Take 1 tablet by mouth 2 (two) times daily.    Dispense:  20 tablet    Refill:  0  . fluconazole (DIFLUCAN) 150 MG tablet    Sig: Take 1 tablet (150 mg total) by mouth once for 1 dose.    Dispense:  1 tablet    Refill:  0     I hope you are feeling better soon!   Seek in person care promptly if your symptoms worsen, new concerns arise or you are not improving with treatment.  It was nice to meet you today. I help [facility] out with telemedicine visits on Tuesdays and Thursdays and am available for visits on those days. If you have any concerns or questions following this visit please schedule a follow up visit with your Primary Care doctor or seek care at a local urgent care clinic to avoid delays in care.

## 2020-11-08 NOTE — Progress Notes (Signed)
Virtual Visit via Video Note  I connected with Jentri  on 11/08/20 at 11:00 AM EST by a video enabled telemedicine application and verified that I am speaking with the correct person using two identifiers.  Location patient: home, [place] Location provider:work or home office Persons participating in the virtual visit: patient, provider  I discussed the limitations of evaluation and management by telemedicine and the availability of in person appointments. The patient expressed understanding and agreed to proceed.   HPI:  Acute telemedicine visit for Sinus issues: -Onset: for a few weeks, but worse since yesterday -Symptoms include: nasal congestion, R ear full, sore throat, runny eyes - now with sinus and tooth pain and thick green nasal congestion, sneezing -Denies: fevers, SOB, CP, NVD -Has tried: nasal saline, flonase -Pertinent past medical history: allergies, sinus infections, asthma - no asthma symptoms with this -denies hx of sinus surgery or recent antibiotics -Pertinent medication allergies:nkda -COVID-19 vaccine status: fully vaccinated + booster, has not had the flu shot yet -has another issue of recurrent yeast vaginitis when take an antibiotic, Required diflucan, request rx in case needs this.   ROS: See pertinent positives and negatives per HPI.  Past Medical History:  Diagnosis Date  . Allergy   . Asthma   . Fibroid   . Medical history non-contributory     Past Surgical History:  Procedure Laterality Date  . DILITATION & CURRETTAGE/HYSTROSCOPY WITH NOVASURE ABLATION N/A 07/24/2013   Procedure: D&C;HYSTEROSCOPY WITH NOVASURE;POSS. RESECTION;  Surgeon: Delice Lesch, MD;  Location: [facility];  Service: Gynecology;  Laterality: N/A;  . WISDOM TOOTH EXTRACTION       Current Outpatient Medications:  .  albuterol (VENTOLIN HFA) 108 (90 Base) MCG/ACT inhaler, INHALE 2 PUFFS INTO THE LUNGS EVERY 6 HOURS AS NEEDED FOR WHEEZING OR SHORTNESS OF BREATH, Disp: 18 g, Rfl: 3 .   amoxicillin-clavulanate (AUGMENTIN) 875-125 MG tablet, Take 1 tablet by mouth 2 (two) times daily., Disp: 20 tablet, Rfl: 0 .  beclomethasone (QVAR REDIHALER) 80 MCG/ACT inhaler, Inhale 1 puff into the lungs 2 (two) times daily., Disp: 10.6 g, Rfl: 3 .  fexofenadine (ALLEGRA) 180 MG tablet, Take 1 tablet (180 mg total) by mouth daily., Disp: 90 tablet, Rfl: 0 .  fluconazole (DIFLUCAN) 150 MG tablet, Take 1 tablet (150 mg total) by mouth once for 1 dose., Disp: 1 tablet, Rfl: 0 .  fluticasone (FLONASE) 50 MCG/ACT nasal spray, 1 spray nasally twice daily until symptoms resolve, Disp: 16 g, Rfl: 1 .  ibuprofen (ADVIL,MOTRIN) 600 MG tablet, ibuprofen 600 mg tablet, Disp: , Rfl:   EXAM:  VITALS per patient if applicable:  GENERAL: alert, oriented, appears well and in no acute distress  HEENT: atraumatic, conjunttiva clear, no obvious abnormalities on inspection of external nose and ears  NECK: normal movements of the head and neck  LUNGS: on inspection no signs of respiratory distress, breathing rate appears normal, no obvious gross SOB, gasping or wheezing  CV: no obvious cyanosis  MS: moves all visible extremities without noticeable abnormality  PSYCH/NEURO: pleasant and cooperative, no obvious depression or anxiety, speech and thought processing grossly intact  ASSESSMENT AND PLAN:  Discussed the following assessment and plan:  Nasal congestion  Allergy, initial encounter  Tooth pain  Yeast vaginitis  -we discussed possible serious and likely etiologies, options for evaluation and workup, limitations of telemedicine visit vs in person visit, treatment, treatment risks and precautions. Pt prefers to treat via telemedicine empirically rather than in person at this moment.  Query developing sinusitis, possibly bacterial, given duration of symptoms and worsening, versus viral illness versus other.  She opted for empiric treatment with Augmentin 875 twice daily for 10 days, nasal  saline, continuation of her allergy regimen.  For the recurrent yeast vaginitis sent Diflucan 1 tablet to take once by mouth if needed.  Discussed options for Covid testing. Work/School slipped offered:  declined Scheduled follow up with PCP offered: Agrees to follow-up as needed Advised to seek prompt in person care if worsening, new symptoms arise, or if is not improving with treatment. Discussed options for inperson care if PCP office not available. Did let this patient know that I only do telemedicine on Tuesdays and Thursdays for [place]. Advised to schedule follow up visit with PCP or [facility] if any further questions or concerns to avoid delays in care.   I discussed the assessment and treatment plan with the patient. The patient was provided an opportunity to ask questions and all were answered. The patient agreed with the plan and demonstrated an understanding of the instructions.     Lucretia Kern, DO

## 2021-01-06 ENCOUNTER — Encounter: Payer: Self-pay | Admitting: Internal Medicine

## 2021-01-06 ENCOUNTER — Telehealth (INDEPENDENT_AMBULATORY_CARE_PROVIDER_SITE_OTHER): Payer: BC Managed Care – PPO | Admitting: Internal Medicine

## 2021-01-06 ENCOUNTER — Other Ambulatory Visit: Payer: Self-pay

## 2021-01-06 ENCOUNTER — Encounter: Payer: Self-pay | Admitting: Family Medicine

## 2021-01-06 DIAGNOSIS — J0110 Acute frontal sinusitis, unspecified: Secondary | ICD-10-CM

## 2021-01-06 DIAGNOSIS — J019 Acute sinusitis, unspecified: Secondary | ICD-10-CM | POA: Insufficient documentation

## 2021-01-06 MED ORDER — AMOXICILLIN-POT CLAVULANATE 875-125 MG PO TABS
1.0000 | ORAL_TABLET | Freq: Two times a day (BID) | ORAL | 0 refills | Status: DC
Start: 2021-01-06 — End: 2021-04-13

## 2021-01-06 MED ORDER — FLUCONAZOLE 150 MG PO TABS: 150.0000 mg | ORAL_TABLET | ORAL | 0 refills | Status: DC

## 2021-01-06 NOTE — Progress Notes (Signed)
Virtual Visit via Video Note  I connected with Wanda Gross on 01/06/21 at  3:20 PM EST by a video enabled telemedicine application and verified that I am speaking with the correct person using two identifiers.  The patient and the provider were at separate locations throughout the entire encounter. Patient location: home, Provider location: work   I discussed the limitations of evaluation and management by telemedicine and the availability of in person appointments. The patient expressed understanding and agreed to proceed. The patient and the provider were the only parties present for the visit unless noted in HPI below.  History of Present Illness: The patient is a 56 y.o. female with visit for cough and headache and congestion. Started 12/26/20. Did have covid-19 test pcr which was negative. Has had improvement in headache but still with cough and congestion. Denies SOB or wheezing. Using qvar regularly recently to help prevent asthma flare and does not feel like she is having that currently. Overall it is stable.   Observations/Objective: Appearance: normal, breathing appears normal, minimal coughing during visit, casual grooming, abdomen does not appear distended, throat not well visualized, mental status is A and O times 3  Assessment and Plan: See problem oriented charting  Follow Up Instructions: rx augmentin and diflucan  I discussed the assessment and treatment plan with the patient. The patient was provided an opportunity to ask questions and all were answered. The patient agreed with the plan and demonstrated an understanding of the instructions.   The patient was advised to call back or seek an in-person evaluation if the symptoms worsen or if the condition fails to improve as anticipated.  Hoyt Koch, MD

## 2021-01-06 NOTE — Assessment & Plan Note (Signed)
Rx augmentin and diflucan. Advised to continue taking otc allergy medications and monitor breathing and if concern for asthma flare to get back in touch with [place].

## 2021-04-13 ENCOUNTER — Telehealth: Payer: BC Managed Care – PPO | Admitting: Family Medicine

## 2021-04-13 DIAGNOSIS — R0982 Postnasal drip: Secondary | ICD-10-CM

## 2021-04-13 DIAGNOSIS — R519 Headache, unspecified: Secondary | ICD-10-CM | POA: Diagnosis not present

## 2021-04-13 DIAGNOSIS — B373 Candidiasis of vulva and vagina: Secondary | ICD-10-CM | POA: Diagnosis not present

## 2021-04-13 DIAGNOSIS — R0981 Nasal congestion: Secondary | ICD-10-CM

## 2021-04-13 DIAGNOSIS — B3731 Acute candidiasis of vulva and vagina: Secondary | ICD-10-CM

## 2021-04-13 MED ORDER — AMOXICILLIN-POT CLAVULANATE 875-125 MG PO TABS: 1.0000 | ORAL_TABLET | Freq: Two times a day (BID) | ORAL | 0 refills | Status: DC

## 2021-04-13 MED ORDER — FLUCONAZOLE 150 MG PO TABS: 150.0000 mg | ORAL_TABLET | Freq: Once | ORAL | 0 refills | Status: AC

## 2021-04-13 NOTE — Progress Notes (Signed)
Virtual Visit via Video Note  I connected with Wanda Gross  on 04/13/21 at  1:20 PM EDT by a video enabled telemedicine application and verified that I am speaking with the correct person using two identifiers.  Location patient: home, [place] Location provider:work or home office Persons participating in the virtual visit: patient, provider  I discussed the limitations of evaluation and management by telemedicine and the availability of in person appointments. The patient expressed understanding and agreed to proceed.   HPI:  Acute telemedicine visit for Sinus Congestion: -Onset: lots of nasal congestion the last few weeks, but worse the last 4-5 days ago -Symptoms include: nasal congestion, thick PND - yellow, cough, sinus discomfort L maxillary- HA, upper teeth hurting on one side -reports the last time she felt like this she saw dentist and xray showed sinusitis instead of tooth issue -Denies: fevers, CP, wheezing, SOB, body aches, vomiting, diarrhea, worst HA, neck stiffness, inability to eat/drink/get out of bed -Has tried: flonase, allergy eye drops, ibuprofen -Pertinent past medical history:allergies, sinus infections, asthma - no asthma symptoms with this -Pertinent medication allergies: nkda -COVID-19 vaccine status: fully vaccinated + booster, has not had the flu shot  ROS: See pertinent positives and negatives per HPI.  Past Medical History:  Diagnosis Date  . Allergy   . Asthma   . Fibroid   . Medical history non-contributory     Past Surgical History:  Procedure Laterality Date  . DILITATION & CURRETTAGE/HYSTROSCOPY WITH NOVASURE ABLATION N/A 07/24/2013   Procedure: D&C;HYSTEROSCOPY WITH NOVASURE;POSS. RESECTION;  Surgeon: Delice Lesch, MD;  Location: [facility];  Service: Gynecology;  Laterality: N/A;  . WISDOM TOOTH EXTRACTION       Current Outpatient Medications:  .  amoxicillin-clavulanate (AUGMENTIN) 875-125 MG tablet, Take 1 tablet by mouth 2 (two) times daily.,  Disp: 20 tablet, Rfl: 0 .  fluconazole (DIFLUCAN) 150 MG tablet, Take 1 tablet (150 mg total) by mouth once for 1 dose., Disp: 1 tablet, Rfl: 0 .  albuterol (VENTOLIN HFA) 108 (90 Base) MCG/ACT inhaler, INHALE 2 PUFFS INTO THE LUNGS EVERY 6 HOURS AS NEEDED FOR WHEEZING OR SHORTNESS OF BREATH, Disp: 18 g, Rfl: 3 .  beclomethasone (QVAR REDIHALER) 80 MCG/ACT inhaler, Inhale 1 puff into the lungs 2 (two) times daily., Disp: 10.6 g, Rfl: 3 .  fexofenadine (ALLEGRA) 180 MG tablet, Take 1 tablet (180 mg total) by mouth daily., Disp: 90 tablet, Rfl: 0 .  fluticasone (FLONASE) 50 MCG/ACT nasal spray, 1 spray nasally twice daily until symptoms resolve, Disp: 16 g, Rfl: 1 .  ibuprofen (ADVIL,MOTRIN) 600 MG tablet, ibuprofen 600 mg tablet, Disp: , Rfl:   EXAM:  VITALS per patient if applicable:  GENERAL: alert, oriented, appears well and in no acute distress  HEENT: atraumatic, conjunttiva clear, no obvious abnormalities on inspection of external nose and ears  NECK: normal movements of the head and neck  LUNGS: on inspection no signs of respiratory distress, breathing rate appears normal, no obvious gross SOB, gasping or wheezing  CV: no obvious cyanosis  MS: moves all visible extremities without noticeable abnormality  PSYCH/NEURO: pleasant and cooperative, no obvious depression or anxiety, speech and thought processing grossly intact  ASSESSMENT AND PLAN:  Discussed the following assessment and plan:  Nasal congestion  PND (post-nasal drip)  Facial discomfort  Yeast vaginitis  -we discussed possible serious and likely etiologies, options for evaluation and workup, limitations of telemedicine visit vs in person visit, treatment, treatment risks and precautions. Pt prefers to treat via  telemedicine empirically rather than in person at this moment.  Query allergic rhinitis, viral respiratory illness versus developing sinusitis.  Discussed possibility of COVID-19 as well.  She has home  Covid test and plans to do one today and tomorrow.  Discussed treatment, potential complications and precautions.  She opted to start with nasal saline, INS and continuation of her allergy pill, with prescription for delayed antibiotic if this is not resolving and improved symptoms.  She requested Diflucan as well, as usually requires this if takes an antibiotic for yeast vaginitis.  We also discussed various options for her ongoing allergies with recurrent sinus infections including seen allergy, ear nose and throat, possible allergy shots, dairy free diet.  She is already trying many of these and has seen an ear nose and throat specialist in the past and has had allergy testing per her report. Work/School slipped offered:  declined Scheduled follow up with PCP offered: Agrees to schedule follow-up as needed Advised to seek prompt in person care if worsening, new symptoms arise, or if is not improving with treatment. Discussed options for inperson care if PCP office not available. Did let this patient know that I only do telemedicine on Tuesdays and Thursdays for [place]. Advised to schedule follow up visit with PCP or [facility] if any further questions or concerns to avoid delays in care.   I discussed the assessment and treatment plan with the patient. The patient was provided an opportunity to ask questions and all were answered. The patient agreed with the plan and demonstrated an understanding of the instructions.     Lucretia Kern, DO

## 2021-04-13 NOTE — Patient Instructions (Signed)
  HOME CARE TIPS:  -[place] testing information: https://www.[facility].org/ OR [phone] Most pharmacies also offer testing and home test kits. Schedule prompt follow-up video visit through [facility] health or your primary care office if you have a positive Covid test.  -I sent the medication(s) we discussed to your pharmacy: Meds ordered this encounter  Medications  . amoxicillin-clavulanate (AUGMENTIN) 875-125 MG tablet    Sig: Take 1 tablet by mouth 2 (two) times daily.    Dispense:  20 tablet    Refill:  0  . fluconazole (DIFLUCAN) 150 MG tablet    Sig: Take 1 tablet (150 mg total) by mouth once for 1 dose.    Dispense:  1 tablet    Refill:  0     -can use tylenol or aleve if needed for fevers, aches and pains per instructions  -can use nasal saline a few times per day if you have nasal congestion; sometimes  a short course of Afrin nasal spray for 3 days can help with symptoms as well  -stay hydrated, drink plenty of fluids and eat small healthy meals - avoid dairy  -can take 1000 IU (10mcg) Vit D3 and 100-500 mg of Vit C daily per instructions  -If the Covid test is positive, check out the CDC website for more information on home care, transmission and treatment for COVID19  -follow up with your doctor in 2-3 days unless improving and feeling better  -stay home while sick, except to seek medical care, and if you have [place] ideally it would be best to stay home for a full 10 days since the onset of symptoms PLUS one day of no fever and feeling better. Wear a good mask (such as N95 or KN95) if around others to reduce the risk of transmission.  It was nice to meet you today, and I really hope you are feeling better soon. I help [facility] out with telemedicine visits on Tuesdays and Thursdays and am available for visits on those days. If you have any concerns or questions following this visit please schedule a follow up visit with your  Primary Care doctor or seek care at a local urgent care clinic to avoid delays in care.    Seek in person care or schedule a follow up video visit promptly if your symptoms worsen, new concerns arise or you are not improving with treatment. Call 911 and/or seek emergency care if your symptoms are severe or life threatening.

## 2021-09-08 ENCOUNTER — Encounter: Payer: Self-pay | Admitting: Family Medicine

## 2022-02-28 ENCOUNTER — Ambulatory Visit: Payer: Self-pay | Admitting: Family Medicine

## 2022-03-22 ENCOUNTER — Encounter: Payer: Self-pay | Admitting: Family Medicine

## 2022-03-22 ENCOUNTER — Ambulatory Visit: Payer: BC Managed Care – PPO | Admitting: Family Medicine

## 2022-03-22 VITALS — BP 126/75 | HR 71 | Temp 97.6°F | Ht 67.0 in | Wt 235.2 lb

## 2022-03-22 DIAGNOSIS — Z1211 Encounter for screening for malignant neoplasm of colon: Secondary | ICD-10-CM | POA: Insufficient documentation

## 2022-03-22 DIAGNOSIS — E559 Vitamin D deficiency, unspecified: Secondary | ICD-10-CM

## 2022-03-22 DIAGNOSIS — Z0000 Encounter for general adult medical examination without abnormal findings: Secondary | ICD-10-CM

## 2022-03-22 DIAGNOSIS — J4520 Mild intermittent asthma, uncomplicated: Secondary | ICD-10-CM | POA: Diagnosis not present

## 2022-03-22 DIAGNOSIS — J301 Allergic rhinitis due to pollen: Secondary | ICD-10-CM

## 2022-03-22 DIAGNOSIS — E669 Obesity, unspecified: Secondary | ICD-10-CM

## 2022-03-22 LAB — LIPID PANEL
Cholesterol: 166 mg/dL (ref 0–200)
HDL: 67 mg/dL (ref >39.00)
LDL Cholesterol: 84 mg/dL (ref 0–99)
NonHDL: 98.69
Total CHOL/HDL Ratio: 2
Triglycerides: 71 mg/dL (ref 0.0–149.0)
VLDL: 14.2 mg/dL (ref 0.0–40.0)

## 2022-03-22 LAB — CBC WITH DIFFERENTIAL/PLATELET
Basophils Absolute: 0 10*3/uL (ref 0.0–0.1)
Basophils Relative: 0.4 % (ref 0.0–3.0)
Eosinophils Absolute: 0.1 10*3/uL (ref 0.0–0.7)
Eosinophils Relative: 1.5 % (ref 0.0–5.0)
HCT: 36.3 % (ref 36.0–46.0)
Hemoglobin: 11.9 g/dL — ABNORMAL LOW (ref 12.0–15.0)
Lymphocytes Relative: 32.2 % (ref 12.0–46.0)
Lymphs Abs: 1.8 10*3/uL (ref 0.7–4.0)
MCHC: 32.9 g/dL (ref 30.0–36.0)
MCV: 85.1 fl (ref 78.0–100.0)
Monocytes Absolute: 0.5 10*3/uL (ref 0.1–1.0)
Monocytes Relative: 8.9 % (ref 3.0–12.0)
Neutro Abs: 3.2 10*3/uL (ref 1.4–7.7)
Neutrophils Relative %: 57 % (ref 43.0–77.0)
Platelets: 249 10*3/uL (ref 150.0–400.0)
RBC: 4.27 Mil/uL (ref 3.87–5.11)
RDW: 14.9 % (ref 11.5–15.5)
WBC: 5.6 10*3/uL (ref 4.0–10.5)

## 2022-03-22 LAB — COMPREHENSIVE METABOLIC PANEL
ALT: 18 U/L (ref 0–35)
AST: 16 U/L (ref 0–37)
Albumin: 4.3 g/dL (ref 3.5–5.2)
Alkaline Phosphatase: 79 U/L (ref 39–117)
BUN: 13 mg/dL (ref 6–23)
CO2: 29 mEq/L (ref 19–32)
Calcium: 9.1 mg/dL (ref 8.4–10.5)
Chloride: 105 mEq/L (ref 96–112)
Creatinine, Ser: 0.76 mg/dL (ref 0.40–1.20)
GFR: 86.86 mL/min (ref >60.00)
Glucose, Bld: 88 mg/dL (ref 70–99)
Potassium: 4.3 mEq/L (ref 3.5–5.1)
Sodium: 140 mEq/L (ref 135–145)
Total Bilirubin: 0.7 mg/dL (ref 0.2–1.2)
Total Protein: 7.2 g/dL (ref 6.0–8.3)

## 2022-03-22 LAB — TSH: TSH: 1.58 u[IU]/mL (ref 0.35–5.50)

## 2022-03-22 LAB — VITAMIN D 25 HYDROXY (VIT D DEFICIENCY, FRACTURES): VITD: 20.3 ng/mL — ABNORMAL LOW (ref 30.00–100.00)

## 2022-03-22 MED ORDER — QVAR REDIHALER 80 MCG/ACT IN AERB: 1.0000 | INHALATION_SPRAY | Freq: Two times a day (BID) | RESPIRATORY_TRACT | 3 refills | Status: DC

## 2022-03-22 NOTE — Patient Instructions (Signed)
Please return in 12 months for your annual complete physical; please come fasting.  ? ?I will release your lab results to you on your MyChart account with further instructions. You may see the results before I do, but when I review them I will send you a message with my report or have my assistant call you if things need to be discussed. Please reply to my message with any questions. Thank you!  ? ?See your GYN for your pap smear and mammogram.  ? ?If you have any questions or concerns, please don't hesitate to send me a message via MyChart or call the office at [phone]. Thank you for visiting with [place] today! It's our pleasure caring for you.  ? ?Please do these things to maintain good health! ? ?Exercise at least 30-45 minutes a day,  4-5 days a week.  ?Eat a low-fat diet with lots of fruits and vegetables, up to 7-9 servings per day. ?Drink plenty of water daily. Try to drink 8 8oz glasses per day. ?Seatbelts can save your life. Always wear your seatbelt. ?Place Smoke Detectors on every level of your home and check batteries every year. ?Schedule an appointment with an eye doctor for an eye exam every 1-2 years ?Safe sex - use condoms to protect yourself from STDs if you could be exposed to these types of infections. Use birth control if you do not want to become pregnant and are sexually active. ?Avoid heavy alcohol use. If you drink, keep it to less than 2 drinks/day and not every day. ?[place].  Choose someone you trust that could speak for you if you became unable to speak for yourself. ?Depression is common in our stressful world.If you're feeling down or losing interest in things you normally enjoy, please come in for a visit. ?If anyone is threatening or hurting you, please get help. Physical or Emotional Violence is never OK.   ?

## 2022-03-22 NOTE — Progress Notes (Signed)
Subjective  Chief Complaint  Patient presents with   Annual Exam   Asthma   Allergic Rhinitis    Obesity   Vitamin D Deficiency    HPI: Wanda Gross is a 57 y.o. female who presents to [facility] at [place] today for a Female Wellness Visit. She also has the concerns and/or needs as listed above in the chief complaint. These will be addressed in addition to the Health Maintenance Visit.   Wellness Visit: annual visit with health maintenance review and exam without Pap  Health maintenance: Last physical was 2 years ago.  She is overdue for female wellness exam and will schedule with her gynecologist.  Due mammogram and Pap smear.  Overall thriving.  Has a new job at [employer].  Worklife balance is good.  Eats healthy, yoga for exercise.  Eligible for Shingrix Chronic disease f/u and/or acute problem visit: (deemed necessary to be done in addition to the wellness visit): Asthma and allergy: Has done very well.  Intermittent albuterol use however during spring allergy season, she admits to worsening control.  Has Qvar but not currently using it.  On Allegra and Flonase for allergies.  No recent asthma exacerbations. Vitamin D deficiency: Rarely takes now.  She did complete a 105-month course of high-dose supplements 2019.  Assessment  1. Annual physical exam   2. Mild intermittent asthma without complication   3. Seasonal allergic rhinitis due to pollen   4. Vitamin D deficiency   5. Obesity (BMI 30-39.9)      Plan  Female Wellness Visit: Age appropriate Health Maintenance and Prevention measures were discussed with patient. Included topics are cancer screening recommendations, ways to keep healthy (see AVS) including dietary and exercise recommendations, regular eye and dental care, use of seat belts, and avoidance of moderate alcohol use and tobacco use.  To schedule with GYN to update Pap smear and mammogram BMI: discussed patient's BMI and encouraged positive lifestyle  modifications to help get to or maintain a target BMI. HM needs and immunizations were addressed and ordered. See below for orders. See HM and immunization section for updates.  Patient will schedule with nurse to return for Shingrix vaccination.  Defers today due to busy schedule. Routine labs and screening tests ordered including cmp, cbc and lipids where appropriate. Discussed recommendations regarding Vit D and calcium supplementation (see AVS)  Chronic disease management visit and/or acute problem visit: Mild intermittent asthma but exacerbated and triggered by allergy season: Recommend restarting Qvar for the next 3 months.  Monitor albuterol use. Seasonal allergies: Allegra and Flonase through spring Recheck vitamin D levels Discussed weight management  Follow up: 12 months for complete physical, nurse visit for Shingrix vaccination Orders Placed This Encounter  Procedures   CBC with Differential/Platelet   Comprehensive metabolic panel   Lipid panel   TSH   VITAMIN D 25 Hydroxy (Vit-D Deficiency, Fractures)   Meds ordered this encounter  Medications   beclomethasone (QVAR REDIHALER) 80 MCG/ACT inhaler    Sig: Inhale 1 puff into the lungs 2 (two) times daily.    Dispense:  10.6 g    Refill:  3      Body mass index is 36.84 kg/m. Wt Readings from Last 3 Encounters:  03/22/22 235 lb 3.2 oz (106.7 kg)  08/05/20 224 lb (101.6 kg)  09/08/19 221 lb 12.8 oz (100.6 kg)     Patient Active Problem List   Diagnosis Date Noted   Seasonal allergic rhinitis due to  pollen 03/21/2018   Obesity (BMI 30-39.9) 02/07/2018   Vitamin D deficiency 02/07/2018   Mild intermittent asthma 02/07/2018   Health Maintenance  Topic Date Due   PAP SMEAR-Modifier  04/19/2020   MAMMOGRAM  08/08/2021   COVID-19 Vaccine (5 - Booster for Moderna series) 04/07/2022 (Originally 01/05/2022)   Zoster Vaccines- Shingrix (1 of 2) 06/22/2022 (Originally 08/10/2014)   TETANUS/TDAP  08/01/2023   COLONOSCOPY  (Pts 45-8yrs Insurance coverage will need to be confirmed)  11/19/2024   INFLUENZA VACCINE  Completed   Hepatitis C Screening  Completed   HIV Screening  Completed   HPV VACCINES  Aged Out   Immunization History  Administered Date(s) Administered   Influenza,inj,Quad PF,6+ Mos 12/11/2018   Influenza-Unspecified 10/22/2017, 10/13/2021   Moderna Sars-Covid-2 Vaccination 02/22/2020, 03/29/2020, 09/28/2020, 11/10/2021   We updated and reviewed the patient's past history in detail and it is documented below. Allergies: Patient has No Known Allergies. Past Medical History Patient  has a past medical history of Allergy, Asthma, and Fibroid. Past Surgical History Patient  has a past surgical history that includes Wisdom tooth extraction and Dilatation & currettage/hysteroscopy with novasure ablation (N/A, 07/24/2013). Family History: Patient family history includes Alcohol abuse in her paternal grandfather; Breast cancer in her maternal aunt and paternal aunt; COPD in her mother; Congestive Heart Failure in her father; Healthy in her brother and daughter; Hypertension in her father and mother; Hypothyroidism in her father and sister; Lung cancer in her maternal grandmother; Ovarian cancer in her paternal grandmother. Social History:  Patient  reports that she has never smoked. She has never used smokeless tobacco. She reports current alcohol use. She reports that she does not use drugs.  Review of Systems: Constitutional: negative for fever or malaise Ophthalmic: negative for photophobia, double vision or loss of vision Cardiovascular: negative for chest pain, dyspnea on exertion, or new LE swelling Respiratory: negative for SOB or persistent cough Gastrointestinal: negative for abdominal pain, change in bowel habits or melena Genitourinary: negative for dysuria or gross hematuria, no abnormal uterine bleeding or disharge Musculoskeletal: negative for new gait disturbance or muscular  weakness Integumentary: negative for new or persistent rashes, no breast lumps Neurological: negative for TIA or stroke symptoms Psychiatric: negative for SI or delusions Allergic/Immunologic: negative for hives  Patient Care Team    Relationship Specialty Notifications Start End  Willow Ora, MD PCP - General Family Medicine  03/21/18   Osborn Coho, MD Consulting Physician Obstetrics and Gynecology  03/21/18     Objective  Vitals: BP 126/75   Pulse 71   Temp 97.6 F (36.4 C) (Temporal)   Ht 5\' 7"  (1.702 m)   Wt 235 lb 3.2 oz (106.7 kg)   SpO2 100%   BMI 36.84 kg/m  General:  Well developed, well nourished, no acute distress  Psych:  Alert and orientedx3,normal mood and affect HEENT:  Normocephalic, atraumatic, non-icteric sclera,  supple neck without adenopathy, mass or thyromegaly Cardiovascular:  Normal S1, S2, RRR without gallop, rub or murmur Respiratory:  Good breath sounds bilaterally, CTAB with normal respiratory effort Gastrointestinal: normal bowel sounds, soft, non-tender, no noted masses. No HSM MSK: no deformities, contusions. Joints are without erythema or swelling.  Skin:  Warm, no rashes or suspicious lesions noted Neurologic:    Mental status is normal. CN 2-11 are normal. Gross motor and sensory exams are normal. Normal gait. No tremor   Commons side effects, risks, benefits, and alternatives for medications and treatment plan prescribed today were discussed, and  the patient expressed understanding of the given instructions. Patient is instructed to call or message via MyChart if he/she has any questions or concerns regarding our treatment plan. No barriers to understanding were identified. We discussed Red Flag symptoms and signs in detail. Patient expressed understanding regarding what to do in case of urgent or emergency type symptoms.  Medication list was reconciled, printed and provided to the patient in AVS. Patient instructions and summary information  was reviewed with the patient as documented in the AVS. This note was prepared with assistance of Dragon voice recognition software. Occasional wrong-word or sound-a-like substitutions may have occurred due to the inherent limitations of voice recognition software  This visit occurred during the SARS-CoV-2 public health emergency.  Safety protocols were in place, including screening questions prior to the visit, additional usage of staff PPE, and extensive cleaning of exam room while observing appropriate contact time as indicated for disinfecting solutions.

## 2022-03-23 MED ORDER — VITAMIN D (ERGOCALCIFEROL) 1.25 MG (50000 UNIT) PO CAPS: 50000.0000 [IU] | ORAL_CAPSULE | ORAL | 0 refills | Status: DC

## 2022-03-23 NOTE — Addendum Note (Signed)
Addended by: Billey Chang on: 03/23/2022 09:23 AM ? ? Modules accepted: Orders ? ?

## 2022-03-29 ENCOUNTER — Ambulatory Visit: Payer: BC Managed Care – PPO

## 2022-04-05 ENCOUNTER — Ambulatory Visit (INDEPENDENT_AMBULATORY_CARE_PROVIDER_SITE_OTHER): Payer: BC Managed Care – PPO | Admitting: *Deleted

## 2022-04-05 DIAGNOSIS — Z23 Encounter for immunization: Secondary | ICD-10-CM | POA: Diagnosis not present

## 2022-04-05 NOTE — Progress Notes (Signed)
Per orders of Dr. Jonni Sanger, injection of Shingles Vaccine given in left deltoid per patient preference by Wanda Gross, CMA. Patient tolerated injection well.  ?

## 2022-04-13 ENCOUNTER — Encounter: Payer: Self-pay | Admitting: Family Medicine

## 2022-04-16 ENCOUNTER — Encounter: Payer: Self-pay | Admitting: Family Medicine

## 2022-04-18 ENCOUNTER — Ambulatory Visit (INDEPENDENT_AMBULATORY_CARE_PROVIDER_SITE_OTHER): Payer: BC Managed Care – PPO | Admitting: Family Medicine

## 2022-04-18 ENCOUNTER — Encounter: Payer: Self-pay | Admitting: Family Medicine

## 2022-04-18 VITALS — BP 118/70 | HR 78 | Temp 98.4°F | Ht 67.0 in | Wt 235.4 lb

## 2022-04-18 DIAGNOSIS — J0100 Acute maxillary sinusitis, unspecified: Secondary | ICD-10-CM

## 2022-04-18 DIAGNOSIS — J301 Allergic rhinitis due to pollen: Secondary | ICD-10-CM | POA: Diagnosis not present

## 2022-04-18 DIAGNOSIS — J45901 Unspecified asthma with (acute) exacerbation: Secondary | ICD-10-CM | POA: Diagnosis not present

## 2022-04-18 MED ORDER — AMOXICILLIN-POT CLAVULANATE 875-125 MG PO TABS: 1.0000 | ORAL_TABLET | Freq: Two times a day (BID) | ORAL | 0 refills | Status: AC

## 2022-04-18 MED ORDER — PREDNISONE 20 MG PO TABS: ORAL_TABLET | ORAL | 0 refills | Status: DC

## 2022-04-18 MED ORDER — GUAIFENESIN-CODEINE 100-10 MG/5ML PO SOLN: 5.0000 mL | Freq: Four times a day (QID) | ORAL | 0 refills | Status: DC | PRN

## 2022-04-18 NOTE — Progress Notes (Signed)
? ?Subjective  ?CC:  ?Chief Complaint  ?Patient presents with  ? Cough  ?  Pt stated that she has been cough for the past 04/12/2022. It has gotten a little better since she has been taking musonix. Also has a Hx, running nose with green flem when she cough and nose is starting to bleed at times. Also sore throat  ? ? ?HPI: Wanda Gross is a 57 y.o. female who presents to the office today to address the problems listed above in the chief complaint. ?57 year old female who has history of mild persistent asthma and allergies who over the last 2 weeks has had increasing wheezing, some tightness in the chest, postnasal drainage, allergy symptoms and now complains of malaise, headache, facial pressure and thick purulent drainage.  She is hacking due to thick drainage.  She is using her albuterol once daily and that is helpful to open her airways.  She denies shortness of breath.  She denies fevers.  She is taking her Qvar and allergy medicines regularly.  She has had no GI symptoms. ? ?Assessment  ?1. Acute non-recurrent maxillary sinusitis   ?2. Mild asthma exacerbation   ?3. Seasonal allergic rhinitis due to pollen   ? ?  ?Plan  ?Acute sinusitis with mild asthma exacerbation and allergies: Discussed how each problem is affecting the other.  Now with overt infection.  Augmentin, prednisone for asthma and cough.  Robitussin-AC at night.  Continue Mucinex DM as needed.  Follow-up if symptoms do not improve.  May continue albuterol as needed. ? ?Follow up: As needed ?Visit date not found ? ?No orders of the defined types were placed in this encounter. ? ?Meds ordered this encounter  ?Medications  ? amoxicillin-clavulanate (AUGMENTIN) 875-125 MG tablet  ?  Sig: Take 1 tablet by mouth 2 (two) times daily for 10 days.  ?  Dispense:  20 tablet  ?  Refill:  0  ? predniSONE (DELTASONE) 20 MG tablet  ?  Sig: Take 3 tabs daily for 5 days  ?  Dispense:  15 tablet  ?  Refill:  0  ? guaiFENesin-codeine 100-10 MG/5ML syrup  ?  Sig:  Take 5 mLs by mouth every 6 (six) hours as needed for cough.  ?  Dispense:  120 mL  ?  Refill:  0  ? ?  ? ?I reviewed the patients updated PMH, FH, and SocHx.  ?  ?Patient Active Problem List  ? Diagnosis Date Noted  ? Seasonal allergic rhinitis due to pollen 03/21/2018  ? Obesity (BMI 30-39.9) 02/07/2018  ? Vitamin D deficiency 02/07/2018  ? Mild intermittent asthma 02/07/2018  ? ?Current Meds  ?Medication Sig  ? albuterol (VENTOLIN HFA) 108 (90 Base) MCG/ACT inhaler INHALE 2 PUFFS INTO THE LUNGS EVERY 6 HOURS AS NEEDED FOR WHEEZING OR SHORTNESS OF BREATH  ? amoxicillin-clavulanate (AUGMENTIN) 875-125 MG tablet Take 1 tablet by mouth 2 (two) times daily for 10 days.  ? beclomethasone (QVAR REDIHALER) 80 MCG/ACT inhaler Inhale 1 puff into the lungs 2 (two) times daily.  ? fluticasone (FLONASE) 50 MCG/ACT nasal spray 1 spray nasally twice daily until symptoms resolve  ? guaiFENesin-codeine 100-10 MG/5ML syrup Take 5 mLs by mouth every 6 (six) hours as needed for cough.  ? Multiple Vitamin (MULTIVITAMIN ADULT PO) Multivitamin  ? predniSONE (DELTASONE) 20 MG tablet Take 3 tabs daily for 5 days  ? Vitamin D, Ergocalciferol, (DRISDOL) 1.25 MG (50000 UNIT) CAPS capsule Take 1 capsule (50,000 Units total) by mouth once a  week.  ? ? ?Allergies: ?Patient has No Known Allergies. ?Family History: ?Patient family history includes Alcohol abuse in her paternal grandfather; Breast cancer in her maternal aunt and paternal aunt; COPD in her mother; Congestive Heart Failure in her father; Healthy in her brother and daughter; Hypertension in her father and mother; Hypothyroidism in her father and sister; Lung cancer in her maternal grandmother; Ovarian cancer in her paternal grandmother. ?Social History:  ?Patient  reports that she has never smoked. She has never used smokeless tobacco. She reports current alcohol use. She reports that she does not use drugs. ? ?Review of Systems: ?Constitutional: Negative for fever malaise or  anorexia ?Cardiovascular: negative for chest pain ?Respiratory: negative for SOB or persistent cough ?Gastrointestinal: negative for abdominal pain ? ?Objective  ?Vitals: BP 118/70   Pulse 78   Temp 98.4 ?F (36.9 ?C)   Ht '5\' 7"'$  (1.702 m)   Wt 235 lb 6.4 oz (106.8 kg)   SpO2 96%   BMI 36.87 kg/m?  ?General: no acute distress , A&Ox3, coughing up thick drainage, appears tired ?HEENT: PEERL, conjunctiva normal, neck is supple, bilateral maxillary sinus tenderness, normal TMs bilaterally, oropharynx is clear ?Cardiovascular:  RRR without murmur or gallop.  ?Respiratory:  Good breath sounds bilaterally, CTAB with normal respiratory effort, no wheezing on exam today ?Skin:  Warm, no rashes ? ? ? ?Commons side effects, risks, benefits, and alternatives for medications and treatment plan prescribed today were discussed, and the patient expressed understanding of the given instructions. Patient is instructed to call or message via MyChart if he/she has any questions or concerns regarding our treatment plan. No barriers to understanding were identified. We discussed Red Flag symptoms and signs in detail. Patient expressed understanding regarding what to do in case of urgent or emergency type symptoms.  ?Medication list was reconciled, printed and provided to the patient in AVS. Patient instructions and summary information was reviewed with the patient as documented in the AVS. ?This note was prepared with assistance of Systems analyst. Occasional wrong-word or sound-a-like substitutions may have occurred due to the inherent limitations of voice recognition software ? ?This visit occurred during the SARS-CoV-2 public health emergency.  Safety protocols were in place, including screening questions prior to the visit, additional usage of staff PPE, and extensive cleaning of exam room while observing appropriate contact time as indicated for disinfecting solutions.  ? ?

## 2022-04-18 NOTE — Patient Instructions (Signed)
Please follow up if symptoms do not improve or as needed.   ? ?Sinus Infection, Adult ?A sinus infection, also called sinusitis, is inflammation of your sinuses. Sinuses are hollow spaces in the bones around your face. Your sinuses are located: ?Around your eyes. ?In the middle of your forehead. ?Behind your nose. ?In your cheekbones. ?Mucus normally drains out of your sinuses. When your nasal tissues become inflamed or swollen, mucus can become trapped or blocked. This allows bacteria, viruses, and fungi to grow, which leads to infection. Most infections of the sinuses are caused by a virus. ?A sinus infection can develop quickly. It can last for up to 4 weeks (acute) or for more than 12 weeks (chronic). A sinus infection often develops after a cold. ?What are the causes? ?This condition is caused by anything that creates swelling in the sinuses or stops mucus from draining. This includes: ?Allergies. ?Asthma. ?Infection from bacteria or viruses. ?Deformities or blockages in your nose or sinuses. ?Abnormal growths in the nose (nasal polyps). ?Pollutants, such as chemicals or irritants in the air. ?Infection from fungi. This is rare. ?What increases the risk? ?You are more likely to develop this condition if you: ?Have a weak body defense system (immune system). ?Do a lot of swimming or diving. ?Overuse nasal sprays. ?Smoke. ?What are the signs or symptoms? ?The main symptoms of this condition are pain and a feeling of pressure around the affected sinuses. Other symptoms include: ?Stuffy nose or congestion that makes it difficult to breathe through your nose. ?Thick yellow or greenish drainage from your nose. ?Tenderness, swelling, and warmth over the affected sinuses. ?A cough that may get worse at night. ?Decreased sense of smell and taste. ?Extra mucus that collects in the throat or the back of the nose (postnasal drip) causing a sore throat or bad breath. ?Tiredness (fatigue). ?Fever. ?How is this  diagnosed? ?This condition is diagnosed based on: ?Your symptoms. ?Your medical history. ?A physical exam. ?Tests to find out if your condition is acute or chronic. This may include: ?Checking your nose for nasal polyps. ?Viewing your sinuses using a device that has a light (endoscope). ?Testing for allergies or bacteria. ?Imaging tests, such as an MRI or CT scan. ?In rare cases, a bone biopsy may be done to rule out more serious types of fungal sinus disease. ?How is this treated? ?Treatment for a sinus infection depends on the cause and whether your condition is chronic or acute. ?If caused by a virus, your symptoms should go away on their own within 10 days. You may be given medicines to relieve symptoms. They include: ?Medicines that shrink swollen nasal passages (decongestants). ?A spray that eases inflammation of the nostrils (topical intranasal corticosteroids). ?Rinses that help get rid of thick mucus in your nose (nasal saline washes). ?Medicines that treat allergies (antihistamines). ?Over-the-counter pain relievers. ?If caused by bacteria, your health care provider may recommend waiting to see if your symptoms improve. Most bacterial infections will get better without antibiotic medicine. You may be given antibiotics if you have: ?A severe infection. ?A weak immune system. ?If caused by narrow nasal passages or nasal polyps, surgery may be needed. ?Follow these instructions at home: ?Medicines ?Take, use, or apply over-the-counter and prescription medicines only as told by your health care provider. These may include nasal sprays. ?If you were prescribed an antibiotic medicine, take it as told by your health care provider. Do not stop taking the antibiotic even if you start to feel better. ?Hydrate and humidify ? ?  Drink enough fluid to keep your urine pale yellow. Staying hydrated will help to thin your mucus. ?Use a cool mist humidifier to keep the humidity level in your home above 50%. ?Inhale steam for  10-15 minutes, 3-4 times a day, or as told by your health care provider. You can do this in the bathroom while a hot shower is running. ?Limit your exposure to cool or dry air. ?Rest ?Rest as much as possible. ?Sleep with your head raised (elevated). ?Make sure you get enough sleep each night. ?General instructions ? ?Apply a warm, moist washcloth to your face 3-4 times a day or as told by your health care provider. This will help with discomfort. ?Use nasal saline washes as often as told by your health care provider. ?Wash your hands often with soap and water to reduce your exposure to germs. If soap and water are not available, use hand sanitizer. ?Do not smoke. Avoid being around people who are smoking (secondhand smoke). ?Keep all follow-up visits. This is important. ?Contact a health care provider if: ?You have a fever. ?Your symptoms get worse. ?Your symptoms do not improve within 10 days. ?Get help right away if: ?You have a severe headache. ?You have persistent vomiting. ?You have severe pain or swelling around your face or eyes. ?You have vision problems. ?You develop confusion. ?Your neck is stiff. ?You have trouble breathing. ?These symptoms may be an emergency. Get help right away. Call 911. ?Do not wait to see if the symptoms will go away. ?Do not drive yourself to the hospital. ?Summary ?A sinus infection is soreness and inflammation of your sinuses. Sinuses are hollow spaces in the bones around your face. ?This condition is caused by nasal tissues that become inflamed or swollen. The swelling traps or blocks the flow of mucus. This allows bacteria, viruses, and fungi to grow, which leads to infection. ?If you were prescribed an antibiotic medicine, take it as told by your health care provider. Do not stop taking the antibiotic even if you start to feel better. ?Keep all follow-up visits. This is important. ?This information is not intended to replace advice given to you by your health care provider.  Make sure you discuss any questions you have with your health care provider. ?Document Revised: 11/21/2021 Document Reviewed: 11/21/2021 ?Elsevier Patient Education ? [place]. ? ?

## 2022-05-07 ENCOUNTER — Encounter: Payer: Self-pay | Admitting: Family Medicine

## 2022-05-07 ENCOUNTER — Ambulatory Visit: Payer: BC Managed Care – PPO | Admitting: Family Medicine

## 2022-05-07 VITALS — BP 118/80 | HR 73 | Temp 98.3°F | Ht 67.0 in | Wt 232.8 lb

## 2022-05-07 DIAGNOSIS — J301 Allergic rhinitis due to pollen: Secondary | ICD-10-CM

## 2022-05-07 DIAGNOSIS — R052 Subacute cough: Secondary | ICD-10-CM

## 2022-05-07 DIAGNOSIS — J4530 Mild persistent asthma, uncomplicated: Secondary | ICD-10-CM

## 2022-05-07 MED ORDER — LEVOCETIRIZINE DIHYDROCHLORIDE 5 MG PO TABS: 5.0000 mg | ORAL_TABLET | Freq: Every evening | ORAL | 5 refills | Status: AC

## 2022-05-07 MED ORDER — FLUTICASONE-SALMETEROL 250-50 MCG/ACT IN AEPB: 1.0000 | INHALATION_SPRAY | Freq: Two times a day (BID) | RESPIRATORY_TRACT | 5 refills | Status: DC

## 2022-05-07 MED ORDER — MONTELUKAST SODIUM 10 MG PO TABS: 10.0000 mg | ORAL_TABLET | Freq: Every day | ORAL | 3 refills | Status: DC

## 2022-05-07 NOTE — Patient Instructions (Signed)
Please follow up if symptoms do not improve or as needed.   ? ?Please switch to xyzal and singulair nightly. Continue flonase. This will help your allergy component.  ?Stop the qvar and change to advair: this will help any asthma component. And I'd like to check a chest xray.  ? ?Please go to our [facility] office to get your xrays done. You can walk in M-F between 8:30am- noon or 1pm - 5pm. Tell them you are there for xrays ordered by me. They will send me the results, then I will let you know the results with instructions.  ? ?Address: [address].  The Xray department is located in the basement.  ? ?

## 2022-05-07 NOTE — Progress Notes (Signed)
? ?Subjective  ?CC:  ?Chief Complaint  ?Patient presents with  ? Cough  ?  Pt stated that she has been coughing for the past 3 weeks and has taken all the cough medication. The flem was clear but now its turning back to green and it is thick. Pt syayed that since starting the medication she has been feeling a little nausea.  ? ? ?HPI: Wanda Gross is a 57 y.o. female who presents to the office today to address the problems listed above in the chief complaint. ?Here for follow-up cough.  Started with mild symptoms back in late March.  She was evaluated in April and treated for sinus infection and slight asthma exacerbation.  Treated with Augmentin, prednisone and cough syrup.  Symptoms of infection has significantly improved although she still has postnasal drainage and persistent cough.  She no longer feels sick.  She no longer has tightness in the chest.  She is on Qvar 80 twice daily.  She takes Environmental manager for allergies.  No more sinus pain.  No fevers or chills.  No cardiac chest pain.  Postnasal drainage makes her mildly nauseous at times.  She has to speak and give presentations at work and the cough is interfering. ? ?Assessment  ?1. Subacute cough   ?2. Mild persistent asthma without complication   ?3. Seasonal allergic rhinitis due to pollen   ? ?  ?Plan  ?Subacute cough: Multifactorial due to allergies, asthma and possibly postinflammatory from recent illness.  She did state prednisone helped.  Changes as follows: Stop Allegra and changed to [place].  Continue Flonase.  Stop Qvar and changed to Advair 150 twice daily.  Check chest x-ray.  Follow-up in next 4 to 6 weeks if symptoms do not improve ? ?Follow up: 4 to 6 weeks if needed ?Visit date not found ? ?Orders Placed This Encounter  ?Procedures  ? DG Chest 2 View  ? ?Meds ordered this encounter  ?Medications  ? montelukast (SINGULAIR) 10 MG tablet  ?  Sig: Take 1 tablet (10 mg total) by mouth at bedtime.  ?  Dispense:  30 tablet   ?  Refill:  3  ? levocetirizine (XYZAL ALLERGY 24HR) 5 MG tablet  ?  Sig: Take 1 tablet (5 mg total) by mouth every evening.  ?  Dispense:  30 tablet  ?  Refill:  5  ? fluticasone-salmeterol (ADVAIR DISKUS) 250-50 MCG/ACT AEPB  ?  Sig: Inhale 1 puff into the lungs in the morning and at bedtime.  ?  Dispense:  1 each  ?  Refill:  5  ? ?  ? ?I reviewed the patients updated PMH, FH, and SocHx.  ?  ?Patient Active Problem List  ? Diagnosis Date Noted  ? Seasonal allergic rhinitis due to pollen 03/21/2018  ? Obesity (BMI 30-39.9) 02/07/2018  ? Vitamin D deficiency 02/07/2018  ? Mild intermittent asthma 02/07/2018  ? ?Current Meds  ?Medication Sig  ? fluticasone-salmeterol (ADVAIR DISKUS) 250-50 MCG/ACT AEPB Inhale 1 puff into the lungs in the morning and at bedtime.  ? levocetirizine (XYZAL ALLERGY 24HR) 5 MG tablet Take 1 tablet (5 mg total) by mouth every evening.  ? montelukast (SINGULAIR) 10 MG tablet Take 1 tablet (10 mg total) by mouth at bedtime.  ? ? ?Allergies: ?Patient has No Known Allergies. ?Family History: ?Patient family history includes Alcohol abuse in her paternal grandfather; Breast cancer in her maternal aunt and paternal aunt; COPD in her mother; Congestive  Heart Failure in her father; Healthy in her brother and daughter; Hypertension in her father and mother; Hypothyroidism in her father and sister; Lung cancer in her maternal grandmother; Ovarian cancer in her paternal grandmother. ?Social History:  ?Patient  reports that she has never smoked. She has never used smokeless tobacco. She reports current alcohol use. She reports that she does not use drugs. ? ?Review of Systems: ?Constitutional: Negative for fever malaise or anorexia ?Cardiovascular: negative for chest pain ?Respiratory: negative for SOB or persistent cough ?Gastrointestinal: negative for abdominal pain ? ?Objective  ?Vitals: BP 118/80   Pulse 73   Temp 98.3 ?F (36.8 ?C)   Ht '5\' 7"'$  (1.702 m)   Wt 232 lb 12.8 oz (105.6 kg)   SpO2  95%   BMI 36.46 kg/m?  ?General: no acute distress , A&Ox3, cough is present, postnasal drainage apparent ?HEENT: PEERL, conjunctiva normal, neck is supple, TMs are clear ?Cardiovascular:  RRR without murmur or gallop.  ?Respiratory:  Good breath sounds bilaterally, CTAB with normal respiratory effort, no wheezing ?Skin:  Warm, no rashes ? ? ? ?Commons side effects, risks, benefits, and alternatives for medications and treatment plan prescribed today were discussed, and the patient expressed understanding of the given instructions. Patient is instructed to call or message via MyChart if he/she has any questions or concerns regarding our treatment plan. No barriers to understanding were identified. We discussed Red Flag symptoms and signs in detail. Patient expressed understanding regarding what to do in case of urgent or emergency type symptoms.  ?Medication list was reconciled, printed and provided to the patient in AVS. Patient instructions and summary information was reviewed with the patient as documented in the AVS. ?This note was prepared with assistance of Systems analyst. Occasional wrong-word or sound-a-like substitutions may have occurred due to the inherent limitations of voice recognition software ? ?This visit occurred during the SARS-CoV-2 public health emergency.  Safety protocols were in place, including screening questions prior to the visit, additional usage of staff PPE, and extensive cleaning of exam room while observing appropriate contact time as indicated for disinfecting solutions.  ? ?

## 2022-06-04 ENCOUNTER — Telehealth: Payer: Self-pay | Admitting: Family Medicine

## 2022-06-04 NOTE — Telephone Encounter (Signed)
Pt requesting refill of prescriptions for persistent ear aches, congestion, and drainage; for which she has been treat two times before this.  Pt declined appointment for today, states she took sleeping medication to get some rest and does not feel comfortable driving.  Pt is scheduled for OV tomorrow 06/06 at 11:00.

## 2022-06-05 ENCOUNTER — Other Ambulatory Visit: Payer: Self-pay | Admitting: Family Medicine

## 2022-06-05 ENCOUNTER — Ambulatory Visit: Payer: BC Managed Care – PPO | Admitting: Family Medicine

## 2022-06-05 ENCOUNTER — Encounter: Payer: Self-pay | Admitting: Family Medicine

## 2022-06-05 VITALS — BP 100/64 | HR 93 | Temp 99.0°F | Ht 67.0 in | Wt 228.6 lb

## 2022-06-05 DIAGNOSIS — J4520 Mild intermittent asthma, uncomplicated: Secondary | ICD-10-CM

## 2022-06-05 DIAGNOSIS — R053 Chronic cough: Secondary | ICD-10-CM | POA: Diagnosis not present

## 2022-06-05 DIAGNOSIS — U071 COVID-19: Secondary | ICD-10-CM | POA: Diagnosis not present

## 2022-06-05 DIAGNOSIS — Z1231 Encounter for screening mammogram for malignant neoplasm of breast: Secondary | ICD-10-CM

## 2022-06-05 LAB — POC COVID19 BINAXNOW: SARS Coronavirus 2 Ag: POSITIVE — AB

## 2022-06-05 MED ORDER — OMEPRAZOLE 20 MG PO CPDR: 20.0000 mg | DELAYED_RELEASE_CAPSULE | Freq: Every day | ORAL | 3 refills | Status: DC

## 2022-06-05 MED ORDER — GUAIFENESIN-CODEINE 100-10 MG/5ML PO SOLN: 5.0000 mL | Freq: Four times a day (QID) | ORAL | 0 refills | Status: DC | PRN

## 2022-06-05 MED ORDER — NIRMATRELVIR/RITONAVIR (PAXLOVID)TABLET: 3.0000 | ORAL_TABLET | Freq: Two times a day (BID) | ORAL | 0 refills | Status: AC

## 2022-06-05 NOTE — Progress Notes (Signed)
Subjective  CC:  Chief Complaint  Patient presents with   Cough    Pt has been coughing since 6/42023. It has not gotten worse but no better either. Other symptoms include rt ear sore, itchy eyes, and sore throat. Has not taken a COVID test.    HPI: Wanda Gross is a 57 y.o. female who presents to the office today to address the problems listed above in the chief complaint. 57 year old with persistent cough.  See last 3 notes.  We have treated with allergy medicines, stepped up asthma therapy, however she reports that her persistent cough has not improved.  She denies symptoms of reflux.  She never did get her chest x-ray.  She did stop the Advair because she felt the beta agonist was causing palpitations. However, over the last 2 days she has felt sick.  He has right ear pain, malaise, low-grade fever, hacking nonproductive cough without shortness of breath or wheezing.  She was exposed to [place] by a coworker recently.  She is vaccinated.  She denies headache, chest pain, nausea vomiting or diarrhea.  Assessment  1. COVID-19 virus infection   2. Chronic cough   3. Mild intermittent asthma without complication      Plan  COVID infection: Because of her history of asthma and persistent cough, qualifies for high risk status.  Recommend starting Paxlovid if she agrees.  Risk and benefits discussed.  Treat supportively with Robitussin-AC, continue allergy medicines, rest and hydration.  Follow-up if symptoms worsen.  Discussed staying home from work to minimize spread.  Patient will work from home this week Chronic cough with intermittent asthma and allergies: Continue medicines.  Trial of oral antiacid in case of silent reflux as contributing.  If persist, recommend pulmonary evaluation.  Patient agrees  Follow up: As needed Visit date not found  Orders Placed This Encounter  Procedures   POC COVID-19   Meds ordered this encounter  Medications   nirmatrelvir/ritonavir EUA (PAXLOVID)  20 x 150 MG & 10 x '100MG'$  TABS    Sig: Take 3 tablets by mouth 2 (two) times daily for 5 days. (Take nirmatrelvir 150 mg two tablets twice daily for 5 days and ritonavir 100 mg one tablet twice daily for 5 days) Patient GFR is 86    Dispense:  30 tablet    Refill:  0   guaiFENesin-codeine 100-10 MG/5ML syrup    Sig: Take 5 mLs by mouth every 6 (six) hours as needed for cough.    Dispense:  120 mL    Refill:  0   omeprazole (PRILOSEC) 20 MG capsule    Sig: Take 1 capsule (20 mg total) by mouth daily.    Dispense:  30 capsule    Refill:  3      I reviewed the patients updated PMH, FH, and SocHx.    Patient Active Problem List   Diagnosis Date Noted   Seasonal allergic rhinitis due to pollen 03/21/2018   Obesity (BMI 30-39.9) 02/07/2018   Vitamin D deficiency 02/07/2018   Mild intermittent asthma 02/07/2018   Current Meds  Medication Sig   albuterol (VENTOLIN HFA) 108 (90 Base) MCG/ACT inhaler INHALE 2 PUFFS INTO THE LUNGS EVERY 6 HOURS AS NEEDED FOR WHEEZING OR SHORTNESS OF BREATH   fluticasone (FLONASE) 50 MCG/ACT nasal spray 1 spray nasally twice daily until symptoms resolve   guaiFENesin-codeine 100-10 MG/5ML syrup Take 5 mLs by mouth every 6 (six) hours as needed for cough.   levocetirizine (XYZAL ALLERGY  24HR) 5 MG tablet Take 1 tablet (5 mg total) by mouth every evening.   montelukast (SINGULAIR) 10 MG tablet Take 1 tablet (10 mg total) by mouth at bedtime.   Multiple Vitamin (MULTIVITAMIN ADULT PO) Multivitamin   nirmatrelvir/ritonavir EUA (PAXLOVID) 20 x 150 MG & 10 x '100MG'$  TABS Take 3 tablets by mouth 2 (two) times daily for 5 days. (Take nirmatrelvir 150 mg two tablets twice daily for 5 days and ritonavir 100 mg one tablet twice daily for 5 days) Patient GFR is 86   omeprazole (PRILOSEC) 20 MG capsule Take 1 capsule (20 mg total) by mouth daily.   Vitamin D, Ergocalciferol, (DRISDOL) 1.25 MG (50000 UNIT) CAPS capsule Take 1 capsule (50,000 Units total) by mouth once a week.    [DISCONTINUED] fluticasone-salmeterol (ADVAIR DISKUS) 250-50 MCG/ACT AEPB Inhale 1 puff into the lungs in the morning and at bedtime.    Allergies: Patient has No Known Allergies. Family History: Patient family history includes Alcohol abuse in her paternal grandfather; Breast cancer in her maternal aunt and paternal aunt; COPD in her mother; Congestive Heart Failure in her father; Healthy in her brother and daughter; Hypertension in her father and mother; Hypothyroidism in her father and sister; Lung cancer in her maternal grandmother; Ovarian cancer in her paternal grandmother. Social History:  Patient  reports that she has never smoked. She has never used smokeless tobacco. She reports current alcohol use. She reports that she does not use drugs.  Review of Systems: Constitutional: Negative for fever malaise or anorexia Cardiovascular: negative for chest pain Respiratory: negative for SOB or persistent cough Gastrointestinal: negative for abdominal pain  Objective  Vitals: BP 100/64   Pulse 93   Temp 99 F (37.2 C)   Ht '5\' 7"'$  (1.702 m)   Wt 228 lb 9.6 oz (103.7 kg)   SpO2 96%   BMI 35.80 kg/m  General: no acute distress , A&Ox3, no respiratory distress but coughing HEENT: PEERL, conjunctiva normal, neck is supple, right TM is injected with normal landmarks Cardiovascular:  RRR without murmur or gallop.  Respiratory:  Good breath sounds bilaterally, CTAB with normal respiratory effort, no wheezing Skin:  Warm, no rashes  Office Visit on 06/05/2022  Component Date Value Ref Range Status   SARS Coronavirus 2 Ag 06/05/2022 Positive (A)  Negative Final     Commons side effects, risks, benefits, and alternatives for medications and treatment plan prescribed today were discussed, and the patient expressed understanding of the given instructions. Patient is instructed to call or message via MyChart if he/she has any questions or concerns regarding our treatment plan. No barriers  to understanding were identified. We discussed Red Flag symptoms and signs in detail. Patient expressed understanding regarding what to do in case of urgent or emergency type symptoms.  Medication list was reconciled, printed and provided to the patient in AVS. Patient instructions and summary information was reviewed with the patient as documented in the AVS. This note was prepared with assistance of Dragon voice recognition software. Occasional wrong-word or sound-a-like substitutions may have occurred due to the inherent limitations of voice recognition software  This visit occurred during the SARS-CoV-2 public health emergency.  Safety protocols were in place, including screening questions prior to the visit, additional usage of staff PPE, and extensive cleaning of exam room while observing appropriate contact time as indicated for disinfecting solutions.

## 2022-06-05 NOTE — Patient Instructions (Signed)
Please follow up if symptoms do not improve or as needed.    Ive sent in the antiviral; you can consider taking it prior to day 5 of symptoms due to your history of cough and asthma. This will prevent progression to severe disease. Cough medications and an antiacid were also called in.  Use the albuterol if you have wheezing.

## 2022-06-13 ENCOUNTER — Ambulatory Visit: Payer: BC Managed Care – PPO

## 2022-06-14 ENCOUNTER — Ambulatory Visit
Admission: RE | Admit: 2022-06-14 | Discharge: 2022-06-14 | Disposition: A | Discharge status: Home / Self Care | Payer: BC Managed Care – PPO | Source: Ambulatory Visit | Attending: Family Medicine | Admitting: Family Medicine

## 2022-06-14 DIAGNOSIS — Z1231 Encounter for screening mammogram for malignant neoplasm of breast: Secondary | ICD-10-CM

## 2022-06-20 ENCOUNTER — Other Ambulatory Visit: Payer: Self-pay | Admitting: Family Medicine

## 2022-06-21 ENCOUNTER — Encounter: Payer: Self-pay | Admitting: Family Medicine

## 2022-06-21 ENCOUNTER — Telehealth (INDEPENDENT_AMBULATORY_CARE_PROVIDER_SITE_OTHER): Payer: BC Managed Care – PPO | Admitting: Family Medicine

## 2022-06-21 VITALS — Wt 228.0 lb

## 2022-06-21 DIAGNOSIS — B3731 Acute candidiasis of vulva and vagina: Secondary | ICD-10-CM | POA: Diagnosis not present

## 2022-06-21 DIAGNOSIS — J40 Bronchitis, not specified as acute or chronic: Secondary | ICD-10-CM | POA: Diagnosis not present

## 2022-06-21 DIAGNOSIS — R059 Cough, unspecified: Secondary | ICD-10-CM

## 2022-06-21 DIAGNOSIS — R0981 Nasal congestion: Secondary | ICD-10-CM | POA: Diagnosis not present

## 2022-06-21 MED ORDER — AMOXICILLIN-POT CLAVULANATE 875-125 MG PO TABS: 1.0000 | ORAL_TABLET | Freq: Two times a day (BID) | ORAL | 0 refills | Status: DC

## 2022-06-21 MED ORDER — FLUTICASONE PROPIONATE HFA 44 MCG/ACT IN AERO: 2.0000 | INHALATION_SPRAY | Freq: Two times a day (BID) | RESPIRATORY_TRACT | 0 refills | Status: DC | PRN

## 2022-06-21 MED ORDER — QVAR REDIHALER 40 MCG/ACT IN AERB: 2.0000 | INHALATION_SPRAY | Freq: Two times a day (BID) | RESPIRATORY_TRACT | 0 refills | Status: DC

## 2022-06-21 MED ORDER — FLUCONAZOLE 150 MG PO TABS: 150.0000 mg | ORAL_TABLET | Freq: Once | ORAL | 0 refills | Status: AC

## 2022-06-21 MED ORDER — BENZONATATE 100 MG PO CAPS: ORAL_CAPSULE | ORAL | 0 refills | Status: DC

## 2022-06-21 NOTE — Patient Instructions (Signed)
    -  I sent the medication(s) we discussed to your pharmacy: Meds ordered this encounter  Medications   amoxicillin-clavulanate (AUGMENTIN) 875-125 MG tablet    Sig: Take 1 tablet by mouth 2 (two) times daily.    Dispense:  20 tablet    Refill:  0   DISCONTD: beclomethasone (QVAR REDIHALER) 40 MCG/ACT inhaler    Pharmacy/Insurance requires pt to try flovent and would not cover this medication.            benzonatate (TESSALON PERLES) 100 MG capsule    Sig: 1-2 capsules up to twice daily as needed for cough    Dispense:  30 capsule    Refill:  0   fluconazole (DIFLUCAN) 150 MG tablet    Sig: Take 1 tablet (150 mg total) by mouth once for 1 dose.    Dispense:  1 tablet    Refill:  0   fluticasone (FLOVENT HFA) 44 MCG/ACT inhaler    Sig: Inhale 2 puffs into the lungs 2 (two) times daily as needed.    Dispense:  1 each    Refill:  0   Avoid dairy products.   I hope you are feeling better soon!  Seek in person care promptly if your symptoms worsen, new concerns arise or you are not improving with treatment.  It was nice to meet you today. I help [facility] out with telemedicine visits on Tuesdays and Thursdays and am happy to help if you need a virtual follow up visit on those days. Otherwise, if you have any concerns or questions following this visit please schedule a follow up visit with your Primary Care office or seek care at a local urgent care clinic to avoid delays in care. If you are having severe or life threatening symptoms please call 911 and/or go to the nearest emergency room.

## 2022-06-21 NOTE — Progress Notes (Signed)
Virtual Visit via Video Note  I connected with Wanda Gross  on 06/21/22 at 11:40 AM EDT by a video enabled telemedicine application and verified that I am speaking with the correct person using two identifiers.  Location patient: [place] Location provider:work or home office Persons participating in the virtual visit: patient, provider  I discussed the limitations and requested verbal permission for telemedicine visit. The patient expressed understanding and agreed to proceed.   HPI:  Acute telemedicine visit for a cough: -Onset: started with covid a few weeks ago but now the drainage worsening -Symptoms include: PND, cough, a ton of thick yellow-greenish phlegm draining in the throat, she has had some upper R tooth discomfort, maxillary facial discomfort, sometimes feels has to take a deeper breath which she thinks is her asthma. Reports doctor recently change her "back to qvar" due to side effects with albuterol. She thought she had refills on her qvar but they are expired. Requesting refill.  -Denies:CP, NVD, malaise -Pertinent past medical history: see below -Pertinent medication allergies:No Known Allergies -COVID-19 vaccine status:  Immunization History  Administered Date(s) Administered   Influenza,inj,Quad PF,6+ Mos 12/11/2018   Influenza-Unspecified 10/22/2017, 10/13/2021   Moderna Sars-Covid-2 Vaccination 02/22/2020, 03/29/2020, 09/28/2020, 11/10/2021   Zoster Recombinat (Shingrix) 04/05/2022     ROS: See pertinent positives and negatives per HPI.  Past Medical History:  Diagnosis Date   Allergy    Asthma    Fibroid     Past Surgical History:  Procedure Laterality Date   DILITATION & CURRETTAGE/HYSTROSCOPY WITH NOVASURE ABLATION N/A 07/24/2013   Procedure: D&C;HYSTEROSCOPY WITH NOVASURE;POSS. RESECTION;  Surgeon: Delice Lesch, MD;  Location: [facility];  Service: Gynecology;  Laterality: N/A;   WISDOM TOOTH EXTRACTION       Current Outpatient Medications:    albuterol  (VENTOLIN HFA) 108 (90 Base) MCG/ACT inhaler, INHALE 2 PUFFS INTO THE LUNGS EVERY 6 HOURS AS NEEDED FOR WHEEZING OR SHORTNESS OF BREATH, Disp: 18 g, Rfl: 3   amoxicillin-clavulanate (AUGMENTIN) 875-125 MG tablet, Take 1 tablet by mouth 2 (two) times daily., Disp: 20 tablet, Rfl: 0   beclomethasone (QVAR REDIHALER) 40 MCG/ACT inhaler, Inhale 2 puffs into the lungs 2 (two) times daily., Disp: 1 each, Rfl: 0   benzonatate (TESSALON PERLES) 100 MG capsule, 1-2 capsules up to twice daily as needed for cough, Disp: 30 capsule, Rfl: 0   fluconazole (DIFLUCAN) 150 MG tablet, Take 1 tablet (150 mg total) by mouth once for 1 dose., Disp: 1 tablet, Rfl: 0   fluticasone (FLONASE) 50 MCG/ACT nasal spray, 1 spray nasally twice daily until symptoms resolve, Disp: 16 g, Rfl: 1   guaiFENesin-codeine 100-10 MG/5ML syrup, Take 5 mLs by mouth every 6 (six) hours as needed for cough., Disp: 120 mL, Rfl: 0   levocetirizine (XYZAL ALLERGY 24HR) 5 MG tablet, Take 1 tablet (5 mg total) by mouth every evening., Disp: 30 tablet, Rfl: 5   montelukast (SINGULAIR) 10 MG tablet, Take 1 tablet (10 mg total) by mouth at bedtime., Disp: 30 tablet, Rfl: 3   Multiple Vitamin (MULTIVITAMIN ADULT PO), Multivitamin, Disp: , Rfl:    omeprazole (PRILOSEC) 20 MG capsule, Take 1 capsule (20 mg total) by mouth daily., Disp: 30 capsule, Rfl: 3   Vitamin D, Ergocalciferol, (DRISDOL) 1.25 MG (50000 UNIT) CAPS capsule, TAKE 1 CAPSULE BY MOUTH 1 TIME A WEEK, Disp: 12 capsule, Rfl: 0  EXAM:  VITALS per patient if applicable:denies fever  GENERAL: alertno acute distress  LUNGS: no obvious gross SOB, gasping or wheezing,  coughing at times  PSYCH/NEURO: pleasant and cooperative, no obvious depression or anxiety, speech and thought processing grossly intact  ASSESSMENT AND PLAN:  Discussed the following assessment and plan:  Nasal congestion  Cough, unspecified type  Bronchitis  Yeast vaginitis  -we discussed possible serious and  likely etiologies, options for evaluation and workup, limitations of telemedicine visit vs in person visit, treatment, treatment risks and precautions. Pt is agreeable to treatment via telemedicine at this moment. Query developing bacterial sinusitis vs other, possibly with bronchitis/asthma. She has opted to try Augmentin, Tessalon for cough, and refilled her qvar per her request as rx was expired. She requested diflucan as well as reports get yeast vaginitis with abx and that OTC products do not work for her.  Addendum: Nurse assistant, Apolonio Schneiders contacted me letting me know prior auth required for the qvar and needs to try flovent first. Changed rx and assistant agreed to let pt know.   Advised to seek prompt virtual visit or in person care if worsening, new symptoms arise, or if is not improving with treatment as expected per our conversation of expected course. Discussed options for follow up care. Did let this patient know that I do telemedicine on Tuesdays and Thursdays for [facility] and those are the days I am logged into the system. Advised to schedule follow up visit with PCP, [facility] virtual visits or [facility] if any further questions or concerns to avoid delays in care.   I discussed the assessment and treatment plan with the patient. 32 minutes spent on this visit including exam and counseling via virtual visit, coordination of care and orders. The patient was provided an opportunity to ask questions and all were answered. The patient agreed with the plan and demonstrated an understanding of the instructions.     Lucretia Kern, DO

## 2022-06-25 ENCOUNTER — Telehealth: Payer: Self-pay | Admitting: *Deleted

## 2022-07-19 ENCOUNTER — Other Ambulatory Visit: Payer: Self-pay | Admitting: Family Medicine

## 2022-08-07 ENCOUNTER — Other Ambulatory Visit: Payer: Self-pay | Admitting: Family Medicine

## 2022-08-07 MED ORDER — PULMICORT FLEXHALER 90 MCG/ACT IN AEPB
2.0000 | INHALATION_SPRAY | Freq: Two times a day (BID) | RESPIRATORY_TRACT | 2 refills | Status: DC
Start: 2022-08-07 — End: 2022-10-19

## 2022-08-30 ENCOUNTER — Other Ambulatory Visit: Payer: Self-pay | Admitting: Family Medicine

## 2022-09-10 ENCOUNTER — Other Ambulatory Visit: Payer: Self-pay | Admitting: Family Medicine

## 2022-09-24 ENCOUNTER — Encounter: Payer: Self-pay | Admitting: *Deleted

## 2022-10-19 ENCOUNTER — Other Ambulatory Visit: Payer: Self-pay | Admitting: Family Medicine

## 2022-10-29 ENCOUNTER — Other Ambulatory Visit: Payer: Self-pay | Admitting: Family Medicine

## 2022-12-13 ENCOUNTER — Encounter: Payer: Self-pay | Admitting: *Deleted

## 2023-03-27 ENCOUNTER — Encounter: Payer: BC Managed Care – PPO | Admitting: Family Medicine

## 2023-03-31 IMAGING — MG MM DIGITAL SCREENING BILAT W/ TOMO AND CAD
8 series · 8 of 24 positions shown · non-contrast
Comparison: Previous exam(s).

CLINICAL DATA: Screening.

EXAM:
DIGITAL SCREENING BILATERAL MAMMOGRAM WITH TOMOSYNTHESIS AND CAD
TECHNIQUE: Bilateral screening digital craniocaudal and mediolateral oblique
mammograms were obtained. Bilateral screening digital breast
tomosynthesis was performed. The images were evaluated with
computer-aided detection.

[L MLO synth-2D]
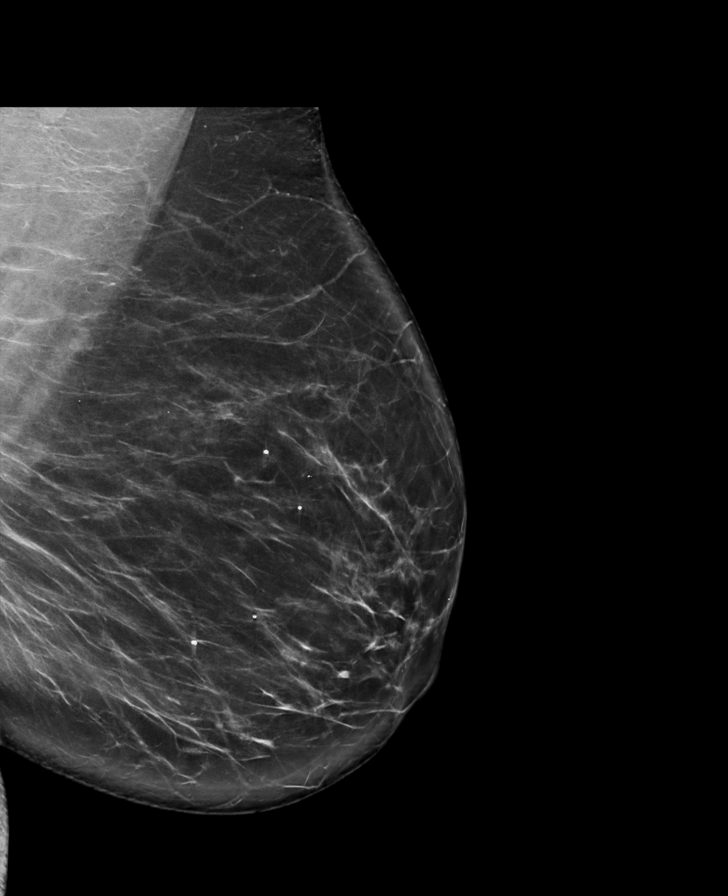

[R CC synth-2D]
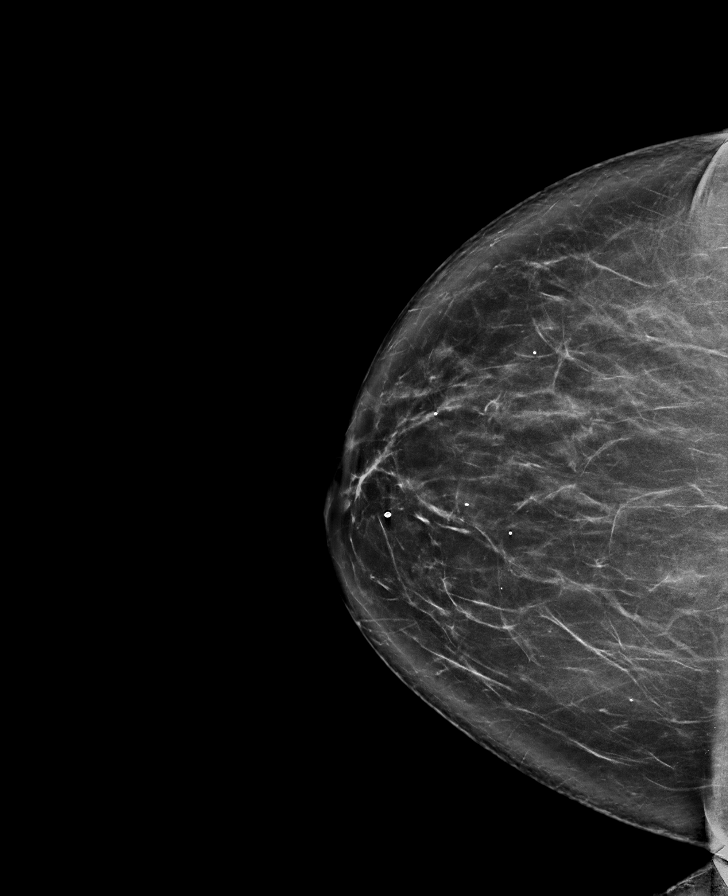

[L CC synth-2D]
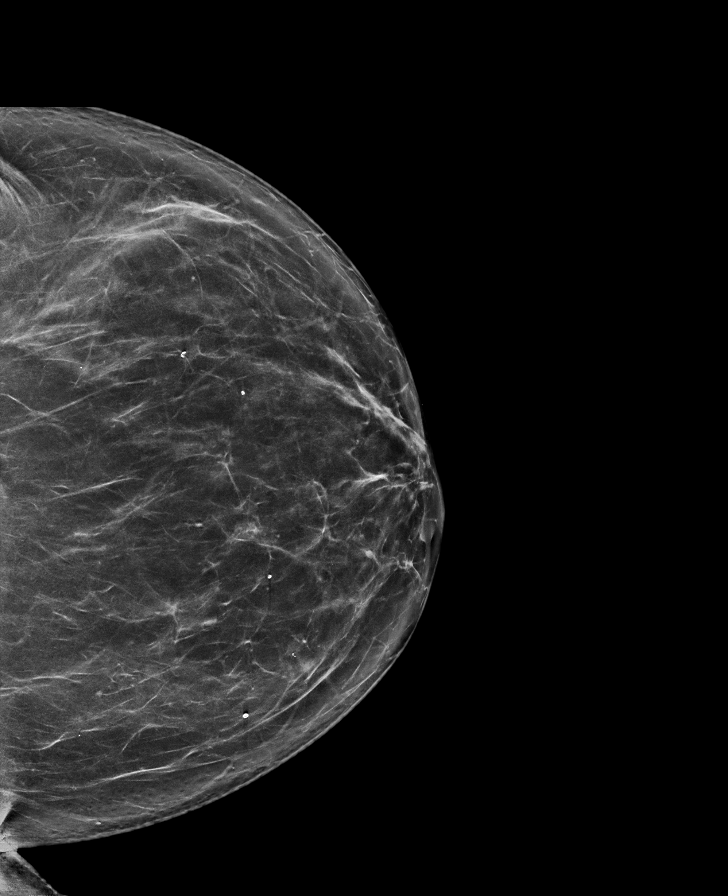

[R MLO synth-2D]
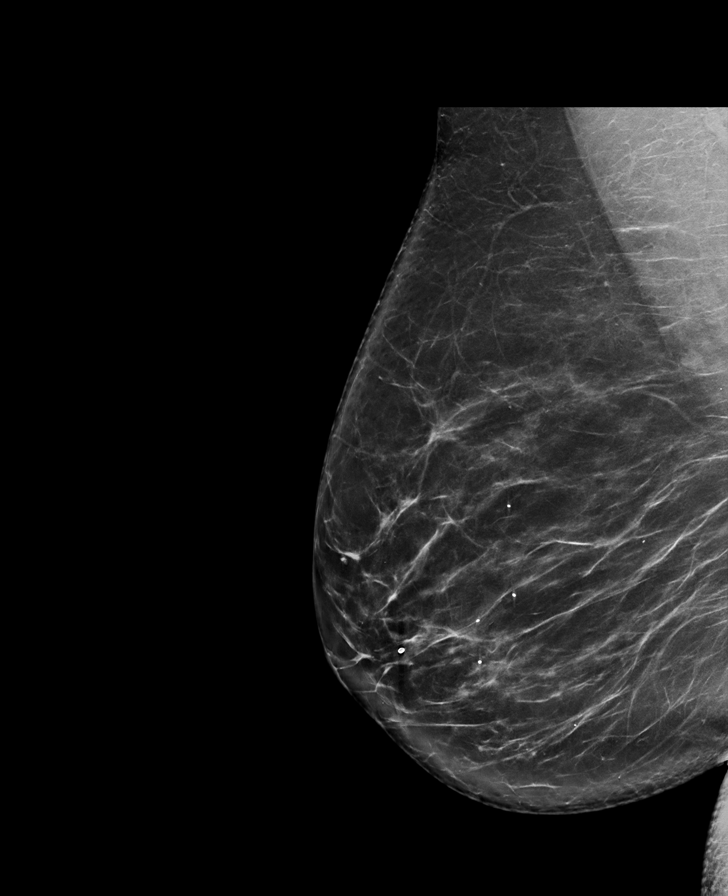

[R CC tomo · tomo slice 49/96.0]
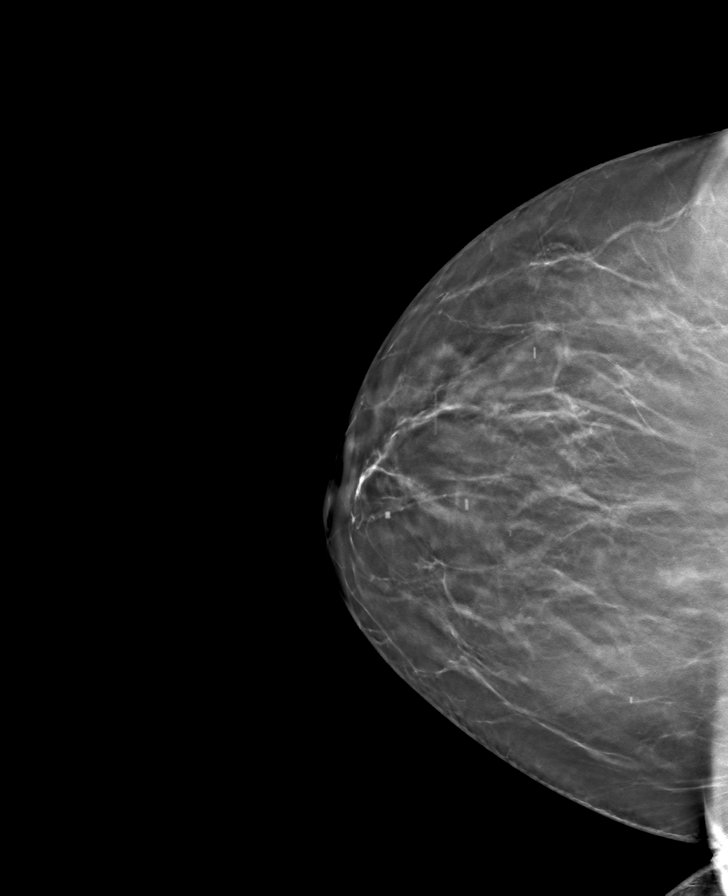

[L CC tomo · tomo slice 44/87.0]
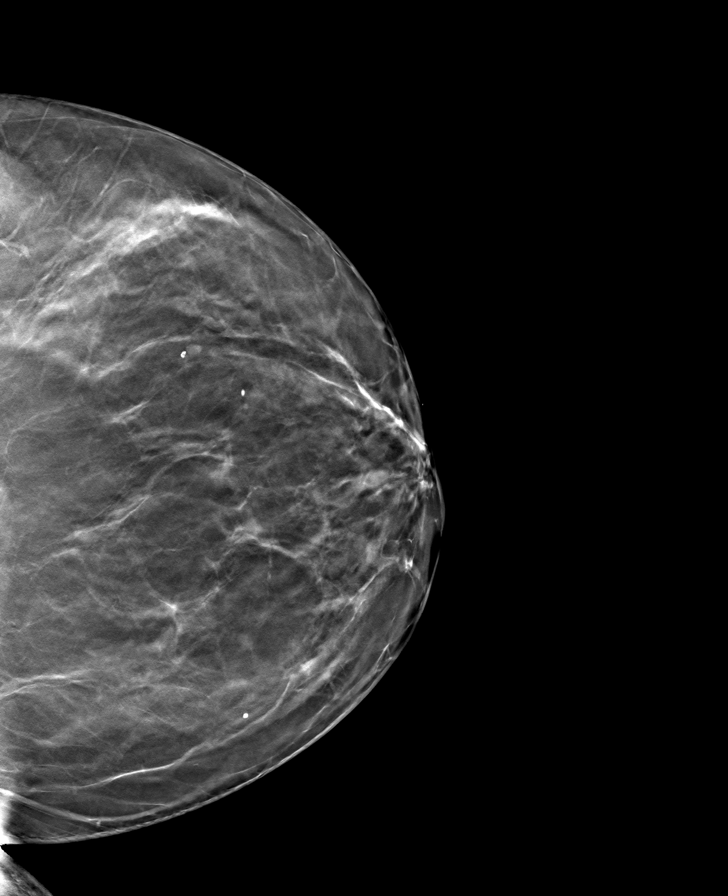

[R MLO tomo · tomo slice 49/97.0]
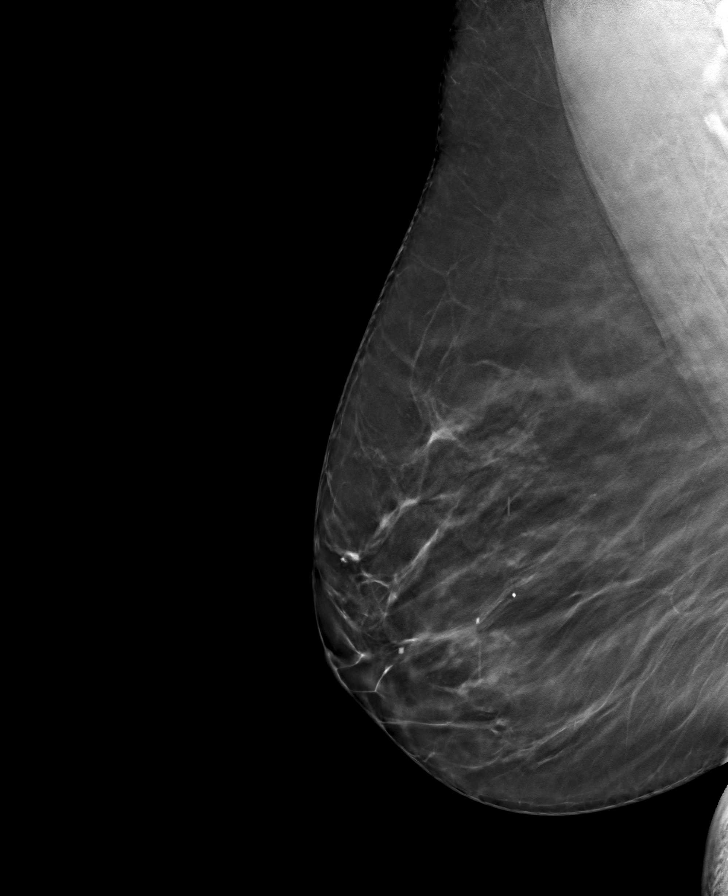

[L MLO tomo · tomo slice 50/99.0]
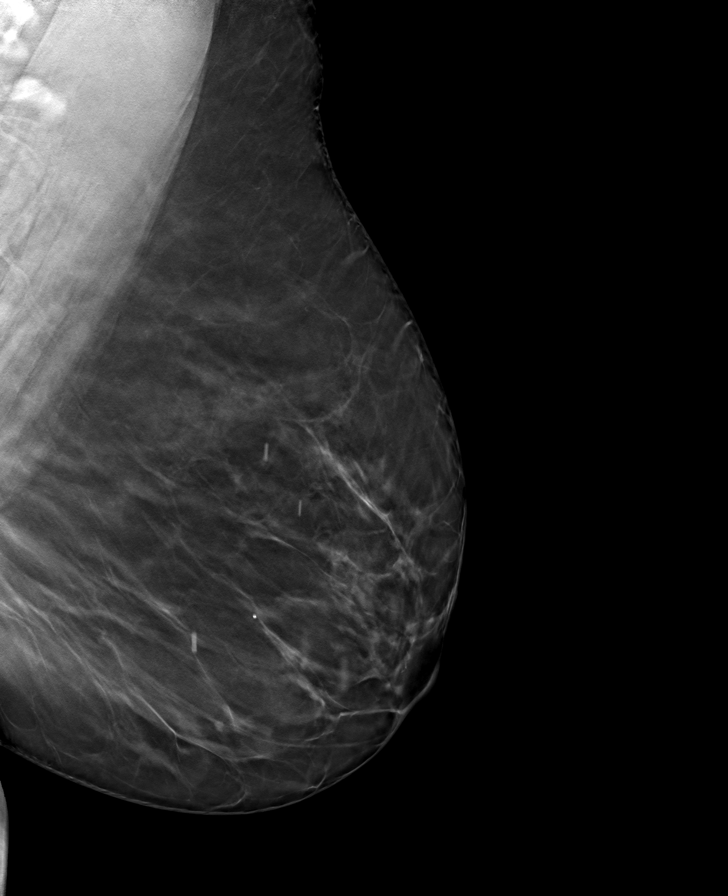

[8 of 24 positions shown; findings below may reference images not displayed]

ACR Breast Density Category b: There are scattered areas of
fibroglandular density.
FINDINGS: There are no findings suspicious for malignancy.
IMPRESSION: No mammographic evidence of malignancy. A result letter of this
screening mammogram will be mailed directly to the patient.

RECOMMENDATION:
Screening mammogram in one year. (Code:51-O-LD2)

BI-RADS CATEGORY  1: Negative.

## 2023-05-28 ENCOUNTER — Encounter: Payer: Self-pay | Admitting: Nurse Practitioner

## 2023-05-28 ENCOUNTER — Telehealth: Payer: BC Managed Care – PPO | Admitting: Nurse Practitioner

## 2023-05-28 DIAGNOSIS — J40 Bronchitis, not specified as acute or chronic: Secondary | ICD-10-CM | POA: Diagnosis not present

## 2023-05-28 DIAGNOSIS — J4540 Moderate persistent asthma, uncomplicated: Secondary | ICD-10-CM

## 2023-05-28 MED ORDER — PSEUDOEPH-BROMPHEN-DM 30-2-10 MG/5ML PO SYRP: 5.0000 mL | ORAL_SOLUTION | Freq: Four times a day (QID) | ORAL | 0 refills | Status: AC | PRN

## 2023-05-28 MED ORDER — FLUCONAZOLE 150 MG PO TABS: ORAL_TABLET | ORAL | 0 refills | Status: DC

## 2023-05-28 MED ORDER — AZITHROMYCIN 250 MG PO TABS: ORAL_TABLET | ORAL | 0 refills | Status: AC

## 2023-05-28 NOTE — Progress Notes (Signed)
Virtual Visit Consent   Wanda Gross, you are scheduled for a virtual visit with a [facility] provider today. Just as with appointments in the office, your consent must be obtained to participate. Your consent will be active for this visit and any virtual visit you may have with one of our providers in the next 365 days. If you have a MyChart account, a copy of this consent can be sent to you electronically.  As this is a virtual visit, video technology does not allow for your provider to perform a traditional examination. This may limit your provider's ability to fully assess your condition. If your provider identifies any concerns that need to be evaluated in person or the need to arrange testing (such as labs, EKG, etc.), we will make arrangements to do so. Although advances in technology are sophisticated, we cannot ensure that it will always work on either your end or our end. If the connection with a video visit is poor, the visit may have to be switched to a telephone visit. With either a video or telephone visit, we are not always able to ensure that we have a secure connection.  By engaging in this virtual visit, you consent to the provision of healthcare and authorize for your insurance to be billed (if applicable) for the services provided during this visit. Depending on your insurance coverage, you may receive a charge related to this service.  I need to obtain your verbal consent now. Are you willing to proceed with your visit today? Wanda Gross has provided verbal consent on 05/28/2023 for a virtual visit (video or telephone). Viviano Simas, FNP  Date: 05/28/2023 7:22 PM  Virtual Visit via Video Note   I, Viviano Simas, connected with  Wanda Gross  ([id number], 15-Jan-1965) on 05/28/23 at  7:30 PM EDT by a video-enabled telemedicine application and verified that I am speaking with the correct person using two identifiers.  Location: Patient: Virtual Visit Location Patient:  Home Provider: Virtual Visit Location Provider: Home Office   I discussed the limitations of evaluation and management by telemedicine and the availability of in person appointments. The patient expressed understanding and agreed to proceed.    History of Present Illness: Wanda Gross is a 58 y.o. who identifies as a female who was assigned female at birth, and is being seen today for a cough.  Symptom onset was 2 weeks ago  She thought initially it was from her allergies  She was seen at a UC last week and was given Bromfed DM for relief  She was also given prednisone for 5 days and has finished that as well   Her cough has persisted  Her cough is productive and phlegm is clear  Her cough is now causing her to vomit at times   She uses Pulmicort HFA daily  Has not tolerated Albuterol well in the past   Problems:  Patient Active Problem List   Diagnosis Date Noted   Seasonal allergic rhinitis due to pollen 03/21/2018   Obesity (BMI 30-39.9) 02/07/2018   Vitamin D deficiency 02/07/2018   Mild intermittent asthma 02/07/2018    Allergies: No Known Allergies Medications:  Current Outpatient Medications:    albuterol (VENTOLIN HFA) 108 (90 Base) MCG/ACT inhaler, INHALE 2 PUFFS INTO THE LUNGS EVERY 6 HOURS AS NEEDED FOR WHEEZING OR SHORTNESS OF BREATH, Disp: 18 g, Rfl: 3   amoxicillin-clavulanate (AUGMENTIN) 875-125 MG tablet, Take 1 tablet by mouth 2 (two) times daily., Disp: 20  tablet, Rfl: 0   benzonatate (TESSALON PERLES) 100 MG capsule, 1-2 capsules up to twice daily as needed for cough, Disp: 30 capsule, Rfl: 0   fluticasone (FLONASE) 50 MCG/ACT nasal spray, 1 spray nasally twice daily until symptoms resolve, Disp: 16 g, Rfl: 1   guaiFENesin-codeine 100-10 MG/5ML syrup, Take 5 mLs by mouth every 6 (six) hours as needed for cough., Disp: 120 mL, Rfl: 0   levocetirizine (XYZAL ALLERGY 24HR) 5 MG tablet, Take 1 tablet (5 mg total) by mouth every evening., Disp: 30 tablet, Rfl: 5    montelukast (SINGULAIR) 10 MG tablet, TAKE 1 TABLET(10 MG) BY MOUTH AT BEDTIME, Disp: 30 tablet, Rfl: 3   Multiple Vitamin (MULTIVITAMIN ADULT PO), Multivitamin, Disp: , Rfl:    omeprazole (PRILOSEC) 20 MG capsule, Take 1 capsule (20 mg total) by mouth daily., Disp: 30 capsule, Rfl: 3   PULMICORT FLEXHALER 90 MCG/ACT inhaler, INHALE 2 PUFFS INTO THE LUNGS TWICE DAILY, Disp: 1 each, Rfl: 2   Vitamin D, Ergocalciferol, (DRISDOL) 1.25 MG (50000 UNIT) CAPS capsule, TAKE 1 CAPSULE BY MOUTH 1 TIME A WEEK, Disp: 12 capsule, Rfl: 3  Observations/Objective: Patient is well-developed, well-nourished in no acute distress.  Resting comfortably  at home.  Head is normocephalic, atraumatic.  No labored breathing.  Speech is clear and coherent with logical content.  Patient is alert and oriented at baseline.    Assessment and Plan: 1. Bronchitis  - azithromycin (ZITHROMAX) 250 MG tablet; Take 2 tablets on day 1, then 1 tablet daily on days 2 through 5  Dispense: 6 tablet; Refill: 0 Take with food   - brompheniramine-pseudoephedrine-DM 30-2-10 MG/5ML syrup; Take 5 mLs by mouth 4 (four) times daily as needed for up to 6 days.  Dispense: 120 mL; Refill: 0  2. Moderate persistent asthma, unspecified whether complicated  Continue to use inhaler as directed   Follow Up Instructions: I discussed the assessment and treatment plan with the patient. The patient was provided an opportunity to ask questions and all were answered. The patient agreed with the plan and demonstrated an understanding of the instructions.  A copy of instructions were sent to the patient via MyChart unless otherwise noted below.    The patient was advised to call back or seek an in-person evaluation if the symptoms worsen or if the condition fails to improve as anticipated.  Time:  I spent 15 minutes with the patient via telehealth technology discussing the above problems/concerns.    Viviano Simas, FNP

## 2023-06-07 ENCOUNTER — Ambulatory Visit: Payer: BC Managed Care – PPO | Admitting: Family Medicine

## 2023-06-07 ENCOUNTER — Encounter: Payer: Self-pay | Admitting: Family Medicine

## 2023-06-07 VITALS — BP 124/82 | HR 72 | Temp 97.9°F | Ht 67.0 in | Wt 231.6 lb

## 2023-06-07 DIAGNOSIS — R053 Chronic cough: Secondary | ICD-10-CM

## 2023-06-07 DIAGNOSIS — J301 Allergic rhinitis due to pollen: Secondary | ICD-10-CM

## 2023-06-07 DIAGNOSIS — J4530 Mild persistent asthma, uncomplicated: Secondary | ICD-10-CM | POA: Diagnosis not present

## 2023-06-07 DIAGNOSIS — J4540 Moderate persistent asthma, uncomplicated: Secondary | ICD-10-CM | POA: Insufficient documentation

## 2023-06-07 MED ORDER — METHYLPREDNISOLONE ACETATE 80 MG/ML IJ SUSP
80.0000 mg | Freq: Once | INTRAMUSCULAR | Status: AC
  Administered 2023-06-07: 80 mg via INTRAMUSCULAR

## 2023-06-07 NOTE — Progress Notes (Unsigned)
Subjective  CC:  Chief Complaint  Patient presents with   Cough    Pt stated that she has had a cough/sore throat for the past 4 weeks. Pt was tested(neg) and was told its allergies that she has   Sore Throat    HPI: Wanda Gross is a 58 y.o. female who presents to the office today to address the problems listed above in the chief complaint. 58 year old with allergies and asthma presents for persistent cough.  Had similar problem a year ago to date.  We had to step up her allergy treatment at that time.  She reports postnasal drainage, irritated throat in the mornings, hacking coughing spells associated with tightness in the chest.  She has been using Pulmicort as needed, albuterol as needed and her allergy medicines as listed below.  She has seen an allergist few years ago.  She has had a productive cough, was treated with a Z-Pak a few weeks ago.  No fevers or chills.  Negative COVID test.  Does not feel sick.  Assessment  1. Mild persistent asthma without complication   2. Seasonal allergic rhinitis due to pollen   3. Persistent cough      Plan  Asthma and allergies: Likely mild to moderate persistent asthma at this time.  Exacerbated by seasonal allergies.  Treat with Depo-Medrol 80 mg IM.  Continue other medications except Pulmicort.  Will restart if symptoms return.  Has follow-up in a few weeks.  If cannot get under better control, would recommend allergist, and PFTs.  Patient understands and agrees with care plan.  Follow up: As scheduled 06/17/2023  No orders of the defined types were placed in this encounter.  No orders of the defined types were placed in this encounter.     I reviewed the patients updated PMH, FH, and SocHx.    Patient Active Problem List   Diagnosis Date Noted   Mild persistent asthma without complication 06/07/2023   Seasonal allergic rhinitis due to pollen 03/21/2018   Obesity (BMI 30-39.9) 02/07/2018   Vitamin D deficiency 02/07/2018   Mild  intermittent asthma 02/07/2018   Current Meds  Medication Sig   albuterol (VENTOLIN HFA) 108 (90 Base) MCG/ACT inhaler INHALE 2 PUFFS INTO THE LUNGS EVERY 6 HOURS AS NEEDED FOR WHEEZING OR SHORTNESS OF BREATH   fluticasone (FLONASE) 50 MCG/ACT nasal spray 1 spray nasally twice daily until symptoms resolve   levocetirizine (XYZAL ALLERGY 24HR) 5 MG tablet Take 1 tablet (5 mg total) by mouth every evening.   montelukast (SINGULAIR) 10 MG tablet TAKE 1 TABLET(10 MG) BY MOUTH AT BEDTIME   Multiple Vitamin (MULTIVITAMIN ADULT PO) Multivitamin   omeprazole (PRILOSEC) 20 MG capsule Take 1 capsule (20 mg total) by mouth daily.   PULMICORT FLEXHALER 90 MCG/ACT inhaler INHALE 2 PUFFS INTO THE LUNGS TWICE DAILY   Vitamin D, Ergocalciferol, (DRISDOL) 1.25 MG (50000 UNIT) CAPS capsule TAKE 1 CAPSULE BY MOUTH 1 TIME A WEEK   [DISCONTINUED] fluconazole (DIFLUCAN) 150 MG tablet Take 1 tablet PO once. Repeat in 3 days if needed.    Allergies: Patient has No Known Allergies. Family History: Patient family history includes Alcohol abuse in her paternal grandfather; Breast cancer in her maternal aunt and paternal aunt; COPD in her mother; Congestive Heart Failure in her father; Healthy in her brother and daughter; Hypertension in her father and mother; Hypothyroidism in her father and sister; Lung cancer in her maternal grandmother; Ovarian cancer in her paternal grandmother. Social History:  Patient  reports that she has never smoked. She has never used smokeless tobacco. She reports current alcohol use. She reports that she does not use drugs.  Review of Systems: Constitutional: Negative for fever malaise or anorexia Cardiovascular: negative for chest pain Respiratory: negative for SOB or persistent cough Gastrointestinal: negative for abdominal pain  Objective  Vitals: BP 124/82   Pulse 72   Temp 97.9 F (36.6 C)   Ht 5\' 7"  (1.702 m)   Wt 231 lb 9.6 oz (105.1 kg)   SpO2 98%   BMI 36.27 kg/m   General: no acute distress , A&Ox3, occasional coughing spells HEENT: PEERL, conjunctiva normal, neck is supple Cardiovascular:  RRR without murmur or gallop.  Respiratory:  Good breath sounds bilaterally, CTAB with normal respiratory effort, no wheezing Skin:  Warm, no rashes  Commons side effects, risks, benefits, and alternatives for medications and treatment plan prescribed today were discussed, and the patient expressed understanding of the given instructions. Patient is instructed to call or message via MyChart if he/she has any questions or concerns regarding our treatment plan. No barriers to understanding were identified. We discussed Red Flag symptoms and signs in detail. Patient expressed understanding regarding what to do in case of urgent or emergency type symptoms.  Medication list was reconciled, printed and provided to the patient in AVS. Patient instructions and summary information was reviewed with the patient as documented in the AVS. This note was prepared with assistance of Dragon voice recognition software. Occasional wrong-word or sound-a-like substitutions may have occurred due to the inherent limitations of voice recognition software

## 2023-06-07 NOTE — Patient Instructions (Addendum)
Please follow up as scheduled for your next visit with me: 06/17/2023   You were given a long acting steroid injection today called depo-medrol.   If you have any questions or concerns, please don't hesitate to send me a message via MyChart or call the office at [phone]. Thank you for visiting with [place] today! It's our pleasure caring for you.   Hold the pulmicort for now; let's see if the injection calms everything down. IF symptoms return, restart the pulmicort twice a day. Use albuterol as needed.

## 2023-06-17 ENCOUNTER — Encounter: Payer: Self-pay | Admitting: Family Medicine

## 2023-06-17 ENCOUNTER — Ambulatory Visit (INDEPENDENT_AMBULATORY_CARE_PROVIDER_SITE_OTHER): Payer: BC Managed Care – PPO | Admitting: Family Medicine

## 2023-06-17 VITALS — BP 130/78 | HR 79 | Temp 98.0°F | Ht 67.0 in | Wt 230.2 lb

## 2023-06-17 DIAGNOSIS — Z0000 Encounter for general adult medical examination without abnormal findings: Secondary | ICD-10-CM | POA: Diagnosis not present

## 2023-06-17 DIAGNOSIS — E559 Vitamin D deficiency, unspecified: Secondary | ICD-10-CM

## 2023-06-17 DIAGNOSIS — J4530 Mild persistent asthma, uncomplicated: Secondary | ICD-10-CM | POA: Diagnosis not present

## 2023-06-17 DIAGNOSIS — Z1231 Encounter for screening mammogram for malignant neoplasm of breast: Secondary | ICD-10-CM

## 2023-06-17 DIAGNOSIS — Z23 Encounter for immunization: Secondary | ICD-10-CM | POA: Diagnosis not present

## 2023-06-17 DIAGNOSIS — Z800 Family history of malignant neoplasm of digestive organs: Secondary | ICD-10-CM | POA: Insufficient documentation

## 2023-06-17 DIAGNOSIS — Z13220 Encounter for screening for lipoid disorders: Secondary | ICD-10-CM

## 2023-06-17 DIAGNOSIS — J301 Allergic rhinitis due to pollen: Secondary | ICD-10-CM | POA: Diagnosis not present

## 2023-06-17 LAB — CBC WITH DIFFERENTIAL/PLATELET
Basophils Absolute: 0 10*3/uL (ref 0.0–0.1)
Basophils Relative: 0.4 % (ref 0.0–3.0)
Eosinophils Absolute: 0.1 10*3/uL (ref 0.0–0.7)
Eosinophils Relative: 1.4 % (ref 0.0–5.0)
HCT: 38.8 % (ref 36.0–46.0)
Hemoglobin: 12.3 g/dL (ref 12.0–15.0)
Lymphocytes Relative: 35.4 % (ref 12.0–46.0)
Lymphs Abs: 1.8 10*3/uL (ref 0.7–4.0)
MCHC: 31.6 g/dL (ref 30.0–36.0)
MCV: 86.3 fl (ref 78.0–100.0)
Monocytes Absolute: 0.4 10*3/uL (ref 0.1–1.0)
Monocytes Relative: 8.5 % (ref 3.0–12.0)
Neutro Abs: 2.8 10*3/uL (ref 1.4–7.7)
Neutrophils Relative %: 54.3 % (ref 43.0–77.0)
Platelets: 264 10*3/uL (ref 150.0–400.0)
RBC: 4.5 Mil/uL (ref 3.87–5.11)
RDW: 15.3 % (ref 11.5–15.5)
WBC: 5.1 10*3/uL (ref 4.0–10.5)

## 2023-06-17 LAB — LIPID PANEL
Cholesterol: 178 mg/dL (ref 0–200)
HDL: 69 mg/dL (ref >39.00)
LDL Cholesterol: 91 mg/dL (ref 0–99)
NonHDL: 109.33
Total CHOL/HDL Ratio: 3
Triglycerides: 92 mg/dL (ref 0.0–149.0)
VLDL: 18.4 mg/dL (ref 0.0–40.0)

## 2023-06-17 LAB — COMPREHENSIVE METABOLIC PANEL
ALT: 22 U/L (ref 0–35)
AST: 17 U/L (ref 0–37)
Albumin: 4.3 g/dL (ref 3.5–5.2)
Alkaline Phosphatase: 85 U/L (ref 39–117)
BUN: 16 mg/dL (ref 6–23)
CO2: 28 mEq/L (ref 19–32)
Calcium: 9.2 mg/dL (ref 8.4–10.5)
Chloride: 105 mEq/L (ref 96–112)
Creatinine, Ser: 0.78 mg/dL (ref 0.40–1.20)
GFR: 83.47 mL/min (ref >60.00)
Glucose, Bld: 91 mg/dL (ref 70–99)
Potassium: 4.5 mEq/L (ref 3.5–5.1)
Sodium: 141 mEq/L (ref 135–145)
Total Bilirubin: 0.5 mg/dL (ref 0.2–1.2)
Total Protein: 7.7 g/dL (ref 6.0–8.3)

## 2023-06-17 LAB — TSH: TSH: 1.88 u[IU]/mL (ref 0.35–5.50)

## 2023-06-17 LAB — VITAMIN D 25 HYDROXY (VIT D DEFICIENCY, FRACTURES): VITD: 27.2 ng/mL — ABNORMAL LOW (ref 30.00–100.00)

## 2023-06-17 NOTE — Progress Notes (Signed)
Subjective  Chief Complaint  Patient presents with   Annual Exam    Pt here for Annual Exam and is currently     HPI: Wanda Gross is a 58 y.o. female who presents to [facility] [place] [facility] today for a Female Wellness Visit. She also has the concerns and/or needs as listed above in the chief complaint. These will be addressed in addition to the Health Maintenance Visit.   Wellness Visit: annual visit with health maintenance review and exam without Pap  HM: Sees GYN for female wellness.  Needs to schedule her mammogram which is now due.  Pap smear is current.  Colorectal cancer screening due next year, however brother was recently diagnosed with colon cancer.  Age 5.  Eligible for second Shingrix vaccination and Tdap booster today.  Eye exam current.  Healthy lifestyle.  Of note, she and her husband are building a home in [place].  They plan to visit in December.  Will eventually move in the next few years. Chronic disease f/u and/or acute problem visit: (deemed necessary to be done in addition to the wellness visit): Asthma and allergies: See note from a few weeks ago.  Had asthma exacerbation due to allergy flare.  Treated with Depo-Medrol and multiple allergy medications.  Fortunately, symptoms have completely resolved.  No longer with thick postnasal drainage, phlegm, wheezing.  She has seen allergy in the past, many years ago.  Typically suffers during the spring months. History of vitamin D deficiency: Due for recheck  Assessment  1. Annual physical exam   2. Need for shingles vaccine   3. Need for Tdap vaccination   4. Screening mammogram for breast cancer   5. Mild persistent asthma without complication   6. Vitamin D deficiency   7. Family history of colon cancer   8. Seasonal allergic rhinitis due to pollen      Plan  Female Wellness Visit: Age appropriate Health Maintenance and Prevention measures were discussed with patient. Included topics are cancer  screening recommendations, ways to keep healthy (see AVS) including dietary and exercise recommendations, regular eye and dental care, use of seat belts, and avoidance of moderate alcohol use and tobacco use.  To see GYN, recommend contacting GI since will now likely need every 5 year colon cancer screening. BMI: discussed patient's BMI and encouraged positive lifestyle modifications to help get to or maintain a target BMI. HM needs and immunizations were addressed and ordered. See below for orders. See HM and immunization section for updates.  Tdap and second Shingrix given today. Routine labs and screening tests ordered including cmp, cbc and lipids where appropriate. Discussed recommendations regarding Vit D and calcium supplementation (see AVS)  Chronic disease management visit and/or acute problem visit: Mild persistent asthma and allergy exacerbation resolved.  Continue allergy medicines.  Monitor for resurgence of asthma symptoms.  Would restart Pulmicort if needed.  Recommend starting allergy medication and Pulmicort in the late winter to prevent exacerbations next year.  If cannot get allergies under better control, refer back to allergist.  Patient agrees Check vitamin D levels Follow up: 12 months for complete physical Orders Placed This Encounter  Procedures   MM 3D SCREENING MAMMOGRAM BILATERAL BREAST   Zoster Recombinant (Shingrix )   Tdap vaccine greater than or equal to 7yo IM   CBC with Differential/Platelet   Comprehensive metabolic panel   Lipid panel   TSH   VITAMIN D 25 Hydroxy (Vit-D Deficiency, Fractures)   No orders  of the defined types were placed in this encounter.     Body mass index is 36.05 kg/m. Wt Readings from Last 3 Encounters:  06/17/23 230 lb 3.2 oz (104.4 kg)  06/07/23 231 lb 9.6 oz (105.1 kg)  06/21/22 228 lb (103.4 kg)     Patient Active Problem List   Diagnosis Date Noted   Family history of colon cancer 06/17/2023    Brother, stage 4 dxd  05/2023, age 63    Mild persistent asthma without complication 06/07/2023   Seasonal allergic rhinitis due to pollen 03/21/2018   Obesity (BMI 30-39.9) 02/07/2018   Vitamin D deficiency 02/07/2018   Mild intermittent asthma 02/07/2018   Health Maintenance  Topic Date Due   MAMMOGRAM  06/15/2023   COVID-19 Vaccine (5 - 2023-24 season) 06/23/2023 (Originally 08/31/2022)   INFLUENZA VACCINE  08/01/2023   Colonoscopy  11/19/2024   PAP SMEAR-Modifier  03/22/2025   DTaP/Tdap/Td (2 - Td or Tdap) 06/16/2033   Hepatitis C Screening  Completed   HIV Screening  Completed   Zoster Vaccines- Shingrix  Completed   HPV VACCINES  Aged Out   Immunization History  Administered Date(s) Administered   Influenza,inj,Quad PF,6+ Mos 12/11/2018   Influenza-Unspecified 10/22/2017, 10/13/2021   Moderna Sars-Covid-2 Vaccination 02/22/2020, 03/29/2020, 09/28/2020, 11/10/2021   Tdap 06/17/2023   Zoster Recombinat (Shingrix) 04/05/2022, 06/17/2023   We updated and reviewed the patient's past history in detail and it is documented below. Allergies: Patient has No Known Allergies. Past Medical History Patient  has a past medical history of Allergy, Asthma, and Fibroid. Past Surgical History Patient  has a past surgical history that includes Wisdom tooth extraction and Dilatation & currettage/hysteroscopy with novasure ablation (N/A, 07/24/2013). Family History: Patient family history includes Alcohol abuse in her paternal grandfather; Breast cancer in her maternal aunt and paternal aunt; COPD in her mother; Colon cancer (age of onset: 31) in her brother; Congestive Heart Failure in her father; Healthy in her brother and daughter; Hypertension in her father and mother; Hypothyroidism in her father and sister; Kidney failure in her mother; Lung cancer in her maternal grandmother; Ovarian cancer in her paternal grandmother. Social History:  Patient  reports that she has never smoked. She has never used smokeless  tobacco. She reports current alcohol use. She reports that she does not use drugs.  Review of Systems: Constitutional: negative for fever or malaise Ophthalmic: negative for photophobia, double vision or loss of vision Cardiovascular: negative for chest pain, dyspnea on exertion, or new LE swelling Respiratory: negative for SOB or persistent cough Gastrointestinal: negative for abdominal pain, change in bowel habits or melena Genitourinary: negative for dysuria or gross hematuria, no abnormal uterine bleeding or disharge Musculoskeletal: negative for new gait disturbance or muscular weakness Integumentary: negative for new or persistent rashes, no breast lumps Neurological: negative for TIA or stroke symptoms Psychiatric: negative for SI or delusions Allergic/Immunologic: negative for hives  Patient Care Team    Relationship Specialty Notifications Start End  Willow Ora, MD PCP - General Family Medicine  03/21/18   Osborn Coho, MD Consulting Physician Obstetrics and Gynecology  03/21/18     Objective  Vitals: BP 130/78   Pulse 79   Temp 98 F (36.7 C)   Ht 5\' 7"  (1.702 m)   Wt 230 lb 3.2 oz (104.4 kg)   SpO2 98%   BMI 36.05 kg/m  General:  Well developed, well nourished, no acute distress  Psych:  Alert and orientedx3,normal mood and affect HEENT:  Normocephalic, atraumatic, non-icteric sclera,  supple neck without adenopathy, mass or thyromegaly Cardiovascular:  Normal S1, S2, RRR without gallop, rub or murmur Respiratory:  Good breath sounds bilaterally, CTAB with normal respiratory effort, no wheeze Gastrointestinal: normal bowel sounds, soft, non-tender, no noted masses. No HSM MSK: extremities without edema, joints without erythema or swelling Neurologic:    Mental status is normal.  Gross motor and sensory exams are normal.  No tremor  Commons side effects, risks, benefits, and alternatives for medications and treatment plan prescribed today were discussed, and  the patient expressed understanding of the given instructions. Patient is instructed to call or message via MyChart if he/she has any questions or concerns regarding our treatment plan. No barriers to understanding were identified. We discussed Red Flag symptoms and signs in detail. Patient expressed understanding regarding what to do in case of urgent or emergency type symptoms.  Medication list was reconciled, printed and provided to the patient in AVS. Patient instructions and summary information was reviewed with the patient as documented in the AVS. This note was prepared with assistance of Dragon voice recognition software. Occasional wrong-word or sound-a-like substitutions may have occurred due to the inherent limitations of voice recognition software

## 2023-06-17 NOTE — Patient Instructions (Signed)

## 2023-06-20 ENCOUNTER — Ambulatory Visit
Admission: RE | Admit: 2023-06-20 | Discharge: 2023-06-20 | Disposition: A | Discharge status: Home / Self Care | Payer: BC Managed Care – PPO | Source: Ambulatory Visit | Attending: Family Medicine | Admitting: Family Medicine

## 2023-06-20 DIAGNOSIS — Z1231 Encounter for screening mammogram for malignant neoplasm of breast: Secondary | ICD-10-CM

## 2023-06-25 NOTE — Progress Notes (Signed)
Labs reviewed.  The 10-year ASCVD risk score (Arnett DK, et al., 2019) is: 3.4%   Values used to calculate the score:     Age: 58 years     Sex: Female     Is Non-Hispanic African American: Yes     Diabetic: No     Tobacco smoker: No     Systolic Blood Pressure: 130 mmHg     Is BP treated: No     HDL Cholesterol: 69 mg/dL     Total Cholesterol: 178 mg/dL  Dear Wanda Gross, Thank you for allowing me to care for you at your recent office visit.  I wanted to let you know that I have reviewed your lab test results and am happy to report that they are normal except your vitamin D level remains a little low.  Please take otc vitamin D 4000 units daily. No other changes are needed :)  Sincerely, Dr. Mardelle Matte

## 2023-07-02 ENCOUNTER — Encounter: Payer: Self-pay | Admitting: Family Medicine

## 2023-07-03 ENCOUNTER — Other Ambulatory Visit: Payer: Self-pay | Admitting: Family Medicine

## 2023-07-03 DIAGNOSIS — J301 Allergic rhinitis due to pollen: Secondary | ICD-10-CM

## 2023-07-03 DIAGNOSIS — J4530 Mild persistent asthma, uncomplicated: Secondary | ICD-10-CM

## 2023-07-03 MED ORDER — PULMICORT FLEXHALER 90 MCG/ACT IN AEPB: 2.0000 | INHALATION_SPRAY | Freq: Two times a day (BID) | RESPIRATORY_TRACT | 2 refills | Status: DC

## 2023-07-04 ENCOUNTER — Other Ambulatory Visit: Payer: Self-pay | Admitting: Family Medicine

## 2023-07-05 ENCOUNTER — Other Ambulatory Visit (HOSPITAL_BASED_OUTPATIENT_CLINIC_OR_DEPARTMENT_OTHER): Payer: Self-pay

## 2023-07-05 MED ORDER — CLINDAMYCIN PHOSPHATE 1 % EX LOTN
TOPICAL_LOTION | Freq: Two times a day (BID) | CUTANEOUS | 3 refills | Status: DC
  Filled 2023-07-05: qty 60, 30d supply, fill #0

## 2023-07-05 MED ORDER — TRIAMCINOLONE ACETONIDE 0.1 % EX CREA
1.0000 | TOPICAL_CREAM | Freq: Two times a day (BID) | CUTANEOUS | 3 refills | Status: AC
  Filled 2023-07-05: qty 30, 15d supply, fill #0

## 2023-07-11 ENCOUNTER — Encounter: Payer: Self-pay | Admitting: Family Medicine

## 2023-07-11 ENCOUNTER — Ambulatory Visit: Payer: BC Managed Care – PPO | Admitting: Family Medicine

## 2023-07-11 VITALS — BP 110/86 | HR 82 | Temp 97.6°F | Ht 67.0 in | Wt 231.6 lb

## 2023-07-11 DIAGNOSIS — J4530 Mild persistent asthma, uncomplicated: Secondary | ICD-10-CM | POA: Diagnosis not present

## 2023-07-11 DIAGNOSIS — J301 Allergic rhinitis due to pollen: Secondary | ICD-10-CM

## 2023-07-11 MED ORDER — BUDESONIDE-FORMOTEROL FUMARATE 160-4.5 MCG/ACT IN AERO: 2.0000 | INHALATION_SPRAY | Freq: Two times a day (BID) | RESPIRATORY_TRACT | 5 refills | Status: DC

## 2023-07-11 MED ORDER — METHYLPREDNISOLONE ACETATE 80 MG/ML IJ SUSP
80.0000 mg | Freq: Once | INTRAMUSCULAR | Status: AC
  Administered 2023-07-11: 80 mg via INTRAMUSCULAR

## 2023-07-11 NOTE — Progress Notes (Signed)
Subjective  CC:  Chief Complaint  Patient presents with   Cough    HPI: Wanda Gross is a 58 y.o. female who presents to the office today to address the problems listed above in the chief complaint. Wanda Gross returns due to allergy symptoms and cough.  She struggles with allergies and her asthma seems more persistent.  Making lots of phlegm but does not feel sick.  No fevers chills cough sore throat or ear pain.  No pleuritic chest pain.  Coughs throughout the night.  Difficult to get through work because coughing is interfering with her being able to talk.  We have treated her with multiple allergy medicines.  She was prescribed Pulmicort but was unable to get it.  She responded very well to the Depo-Medrol injection several months back.  However, all of her symptoms returned about 2 weeks ago.  She does have an appointment with an allergist in about a month.    Assessment  1. Seasonal allergic rhinitis due to pollen   2. Mild persistent asthma without complication      Plan  Allergies and asthma exacerbation: Mainly inflammatory.  I discussed with her risks of repeated steroid injections versus oral and inhaled steroids.  Due to her job requirements, she elects steroid injection.  Depo-Medrol 80 mg IM today.  I have ordered Symbicort to start.  Continue allergy medicines.  Follow-up with allergist for further allergy testing and treatment options.  She may be an immunotherapy candidate.  Her respiratory status is stable today.  There are no signs of infection.  Follow up: As scheduled 11/18/2023  No orders of the defined types were placed in this encounter.  Meds ordered this encounter  Medications   methylPREDNISolone acetate (DEPO-MEDROL) injection 80 mg   budesonide-formoterol (SYMBICORT) 160-4.5 MCG/ACT inhaler    Sig: Inhale 2 puffs into the lungs in the morning and at bedtime.    Dispense:  10.2 g    Refill:  5      I reviewed the patients updated PMH, FH, and SocHx.     Patient Active Problem List   Diagnosis Date Noted   Family history of colon cancer 06/17/2023   Mild persistent asthma without complication 06/07/2023   Seasonal allergic rhinitis due to pollen 03/21/2018   Obesity (BMI 30-39.9) 02/07/2018   Vitamin D deficiency 02/07/2018   Current Meds  Medication Sig   albuterol (VENTOLIN HFA) 108 (90 Base) MCG/ACT inhaler INHALE 2 PUFFS INTO THE LUNGS EVERY 6 HOURS AS NEEDED FOR WHEEZING OR SHORTNESS OF BREATH   budesonide-formoterol (SYMBICORT) 160-4.5 MCG/ACT inhaler Inhale 2 puffs into the lungs in the morning and at bedtime.   clindamycin (CLEOCIN T) 1 % lotion Apply topically 2 (two) times daily.   fluticasone (FLONASE) 50 MCG/ACT nasal spray 1 spray nasally twice daily until symptoms resolve   levocetirizine (XYZAL ALLERGY 24HR) 5 MG tablet Take 1 tablet (5 mg total) by mouth every evening.   montelukast (SINGULAIR) 10 MG tablet TAKE 1 TABLET(10 MG) BY MOUTH AT BEDTIME   Multiple Vitamin (MULTIVITAMIN ADULT PO) Multivitamin   omeprazole (PRILOSEC) 20 MG capsule Take 1 capsule (20 mg total) by mouth daily.   triamcinolone cream (KENALOG) 0.1 % Apply 1 Application topically 2 (two) times daily.   [DISCONTINUED] Budesonide (PULMICORT FLEXHALER) 90 MCG/ACT inhaler Inhale 2 puffs into the lungs 2 (two) times daily.   Current Facility-Administered Medications for the 07/11/23 encounter (Office Visit) with Willow Ora, MD  Medication   methylPREDNISolone acetate (  DEPO-MEDROL) injection 80 mg    Allergies: Patient has No Known Allergies. Family History: Patient family history includes Alcohol abuse in her paternal grandfather; Breast cancer in her maternal aunt and paternal aunt; COPD in her mother; Colon cancer (age of onset: 51) in her brother; Congestive Heart Failure in her father; Healthy in her brother and daughter; Hypertension in her father and mother; Hypothyroidism in her father and sister; Kidney failure in her mother; Lung cancer in  her maternal grandmother; Ovarian cancer in her paternal grandmother. Social History:  Patient  reports that she has never smoked. She has never used smokeless tobacco. She reports current alcohol use. She reports that she does not use drugs.  Review of Systems: Constitutional: Negative for fever malaise or anorexia Cardiovascular: negative for chest pain Respiratory: negative for SOB or persistent cough Gastrointestinal: negative for abdominal pain  Objective  Vitals: BP 110/86   Pulse 82   Temp 97.6 F (36.4 C)   Ht 5\' 7"  (1.702 m)   Wt 231 lb 9.6 oz (105.1 kg)   SpO2 98%   BMI 36.27 kg/m  General: no acute distress , A&Ox3, coughing, clear phlegm HEENT: PEERL, conjunctiva normal, neck is supple Cardiovascular:  RRR without murmur or gallop.  Respiratory:  Good breath sounds bilaterally, CTAB with normal respiratory effort, no wheezing today.  Good air movement Skin:  Warm, no rashes  Commons side effects, risks, benefits, and alternatives for medications and treatment plan prescribed today were discussed, and the patient expressed understanding of the given instructions. Patient is instructed to call or message via MyChart if he/she has any questions or concerns regarding our treatment plan. No barriers to understanding were identified. We discussed Red Flag symptoms and signs in detail. Patient expressed understanding regarding what to do in case of urgent or emergency type symptoms.  Medication list was reconciled, printed and provided to the patient in AVS. Patient instructions and summary information was reviewed with the patient as documented in the AVS. This note was prepared with assistance of Dragon voice recognition software. Occasional wrong-word or sound-a-like substitutions may have occurred due to the inherent limitations of voice recognition software

## 2023-07-11 NOTE — Patient Instructions (Signed)
Please follow up as scheduled for your next visit with me: 11/18/2023   If you have any questions or concerns, please don't hesitate to send me a message via MyChart or call the office at [phone]. Thank you for visiting with [place] today! It's our pleasure caring for you.

## 2023-08-14 ENCOUNTER — Other Ambulatory Visit: Payer: Self-pay

## 2023-08-14 ENCOUNTER — Ambulatory Visit: Payer: BC Managed Care – PPO | Admitting: Internal Medicine

## 2023-08-14 ENCOUNTER — Encounter: Payer: Self-pay | Admitting: Internal Medicine

## 2023-08-14 VITALS — BP 114/76 | HR 89 | Temp 98.2°F | Resp 16 | Ht 66.0 in | Wt 235.2 lb

## 2023-08-14 DIAGNOSIS — K219 Gastro-esophageal reflux disease without esophagitis: Secondary | ICD-10-CM

## 2023-08-14 DIAGNOSIS — J4540 Moderate persistent asthma, uncomplicated: Secondary | ICD-10-CM | POA: Diagnosis not present

## 2023-08-14 DIAGNOSIS — J3089 Other allergic rhinitis: Secondary | ICD-10-CM

## 2023-08-14 DIAGNOSIS — R053 Chronic cough: Secondary | ICD-10-CM | POA: Diagnosis not present

## 2023-08-14 MED ORDER — MONTELUKAST SODIUM 10 MG PO TABS
10.0000 mg | ORAL_TABLET | Freq: Every day | ORAL | 5 refills | Status: DC
Start: 2023-08-14 — End: 2023-11-13

## 2023-08-14 MED ORDER — BREZTRI AEROSPHERE 160-9-4.8 MCG/ACT IN AERO: 2.0000 | INHALATION_SPRAY | Freq: Two times a day (BID) | RESPIRATORY_TRACT | 5 refills | Status: DC

## 2023-08-14 MED ORDER — ESOMEPRAZOLE MAGNESIUM 40 MG PO CPDR: 40.0000 mg | DELAYED_RELEASE_CAPSULE | Freq: Every day | ORAL | 1 refills | Status: DC

## 2023-08-14 NOTE — Patient Instructions (Addendum)
Moderate Persistent Asthma Chronic Cough - Maintenance inhaler:  - Stop Symbicort.   - Start Breztri 160-9-4.29mcg 2 puffs twice daily with spacer.  - Continue Montelukast 10mg  daily. Stop if it causes any mood or behavioral changes.  - Rescue inhaler: Albuterol 2 puffs via spacer or 1 vial via nebulizer every 4-6 hours as needed for respiratory symptoms of cough, shortness of breath, or wheezing Asthma control goals:  Full participation in all desired activities (may need albuterol before activity) Albuterol use two times or less a week on average (not counting use with activity) Cough interfering with sleep two times or less a month Oral steroids no more than once a year No hospitalizations   Chronic Rhinitis: - Consider allergy testing if the cough is not improved. Would need to hold all anti histamines 3 days prior.  - Use nasal saline rinses before nose sprays such as with Neilmed Sinus Rinse.  Use distilled water.   - Use Flonase Sensimist 2 sprays each nostril daily. Aim upward and outward. - Use Singulair 10mg  daily.  Stop if there are any mood/behavioral changes.  Silent GERD - Start Nexium 40mg  in the morning on an empty stomach. Eat a snack or meal about 30-45 minutes after.

## 2023-08-14 NOTE — Progress Notes (Signed)
NEW PATIENT  Date of Service/Encounter:  08/14/23  Consult requested by: Willow Ora, MD   Subjective:   Wanda Gross (DOB: [birth year]) is a 58 y.o. female who presents to the clinic on 08/14/2023 with a chief complaint of Cough, Asthma, and Establish Care .    History obtained from: chart review and patient.  Cough/SOB:  Not official diagnosis.  Started having trouble with cough and SOB about 8-10 years ago.  Underwent allergy testing at the time and recalls some positivity but not the exact results.  She still is having trouble with persistent cough and a tickle in her throat.  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  Also having nighttime cough. Taking Symbicort 160 and Singulair with slight relief; has received dexamethasone shots x2 which helped.  Tried Pulmicort without relief in the past.   0 ED visits/UC visits and 1-2 oral steroids in the past year 0 number of lifetime hospitalizations, 0 number of lifetime intubations.  Identified Triggers: talking  Prior PFTs or spirometry: none available for review  Previously used therapies: Pulmicort  Current regimen:  Maintenance: Symbicort  Rescue: Albuterol 2 puffs q4-6 hrs PRN   Rhinitis:  Started about 10 years ago.   Symptoms include: nasal congestion and post nasal drainage  Occurs year-round Potential triggers: not sure   Treatments tried:  Flonase Would be interested in AIT but does not want to do skin testing at this time; recalls it being painful  Previous allergy testing: no History of sinus surgery: none Nonallergic triggers: none   Reflux: Denies any issues with heartburn or reflux.  Lots of phelgm too and overnight cough though.   Not on a PPI   Past Medical History: Past Medical History:  Diagnosis Date   Allergy    Asthma    Fibroid      Past Surgical History: Past Surgical History:  Procedure Laterality Date   DILITATION &  CURRETTAGE/HYSTROSCOPY WITH NOVASURE ABLATION N/A 07/24/2013   Procedure: D&C;HYSTEROSCOPY WITH NOVASURE;POSS. RESECTION;  Surgeon: Purcell Nails, MD;  Location: [facility];  Service: Gynecology;  Laterality: N/A;   WISDOM TOOTH EXTRACTION      Family History: Family History  Problem Relation Age of Onset   Allergic rhinitis Mother    COPD Mother    Hypertension Mother    Kidney failure Mother        dialysis at age 55, ? dx   Hypertension Father    Congestive Heart Failure Father    Hypothyroidism Father    Hypothyroidism Sister    Healthy Brother    Colon cancer Brother 62       dxd 05/2023, stage 4   Breast cancer Maternal Aunt    Breast cancer Paternal Aunt    Allergic rhinitis Maternal Grandmother    Lung cancer Maternal Grandmother    Ovarian cancer Paternal Grandmother    Alcohol abuse Paternal Grandfather    Healthy Daughter    Medication List:  Allergies as of 08/14/2023   No Known Allergies      Medication List        Accurate as of August 14, 2023 10:56 AM. If you have any questions, ask your nurse or doctor.          albuterol 108 (90 Base) MCG/ACT inhaler Commonly known as: VENTOLIN HFA INHALE 2 PUFFS INTO THE LUNGS EVERY 6 HOURS AS NEEDED FOR WHEEZING OR SHORTNESS OF  BREATH   budesonide-formoterol 160-4.5 MCG/ACT inhaler Commonly known as: SYMBICORT Inhale 2 puffs into the lungs in the morning and at bedtime.   Cholecalciferol 1.25 MG (50000 UT) capsule Take 50,000 Units by mouth daily.   clindamycin 1 % lotion Commonly known as: CLEOCIN T Apply topically 2 (two) times daily.   fluticasone 50 MCG/ACT nasal spray Commonly known as: FLONASE 1 spray nasally twice daily until symptoms resolve   levocetirizine 5 MG tablet Commonly known as: Xyzal Allergy 24HR Take 1 tablet (5 mg total) by mouth every evening.   montelukast 10 MG tablet Commonly known as: SINGULAIR TAKE 1 TABLET(10 MG) BY MOUTH AT BEDTIME   MULTIVITAMIN ADULT  PO Multivitamin   omeprazole 20 MG capsule Commonly known as: PRILOSEC Take 1 capsule (20 mg total) by mouth daily.   triamcinolone cream 0.1 % Commonly known as: KENALOG Apply 1 Application topically 2 (two) times daily.         REVIEW OF SYSTEMS: Pertinent positives and negatives discussed in HPI.   Objective:   Physical Exam: BP 114/76   Pulse 89   Temp 98.2 F (36.8 C) (Temporal)   Resp 16   Ht 5\' 6"  (1.676 m)   Wt 235 lb 3.2 oz (106.7 kg)   SpO2 98%   BMI 37.96 kg/m  Body mass index is 37.96 kg/m. GEN: alert, well developed HEENT: clear conjunctiva, TM grey and translucent, nose with + inferior turbinate hypertrophy, pink nasal mucosa, slight clear rhinorrhea, + cobblestoning HEART: regular rate and rhythm, no murmur LUNGS: clear to auscultation bilaterally, no coughing, unlabored respiration ABDOMEN: soft, non distended  SKIN: no rashes or lesions  Reviewed:  07/11/2023: seen by Dr Mardelle Matte for allergies and asthma exacerbation.  Discussed depo medrol 80mg  x1 IM.  Also started on Symbicort. Referred to Allergy for further evaluation.   05/28/2023: seen by Jimmey Ralph NP for cough, started 2 weeks ago.  Given prednisone and on Pulmicort but with persistent cough.  Given azithromycin.  05/16/2023: seen by urgent care for acute URI and mild persistent asthma with exacerbation. Inspiratory wheezing? Started on bromfed DM, Albuterol PRN and oral prednisone.    Spirometry:  Tracings reviewed. Her effort: Good reproducible efforts. FVC: 2.75L FEV1: 2.11L, 91% predicted FEV1/FVC ratio: 77% Interpretation: Spirometry consistent with normal pattern.  Please see scanned spirometry results for details.   Assessment:   1. Moderate persistent asthma without complication   2. Chronic cough   3. Other allergic rhinitis   4. Gastroesophageal reflux disease, unspecified whether esophagitis present     Plan/Recommendations:  Moderate Persistent Asthma Chronic Cough - Normal  spirometry today.  Persistent cough despite Symbicort.  Will try triple therapy with spacer; if no improvement then I do not think asthma is the cause for the cough. We discussed upper airway post nasal drip can also cause it; she wishes to hold off on allergy testing at this time.  Will also treat for possible silent reflux as cause for cough.   - Maintenance inhaler:  - Stop Symbicort.   - Start Breztri 160-9-4.65mcg 2 puffs twice daily with spacer.  - Continue Montelukast 10mg  daily. Stop if it causes any mood or behavioral changes.  - Rescue inhaler: Albuterol 2 puffs via spacer or 1 vial via nebulizer every 4-6 hours as needed for respiratory symptoms of cough, shortness of breath, or wheezing Asthma control goals:  Full participation in all desired activities (may need albuterol before activity) Albuterol use two times or less a week on average (  not counting use with activity) Cough interfering with sleep two times or less a month Oral steroids no more than once a year No hospitalizations   Chronic Rhinitis: - Consider allergy testing if the cough is not improved. Would need to hold all anti histamines 3 days prior.  - Use nasal saline rinses before nose sprays such as with Neilmed Sinus Rinse.  Use distilled water.   - Use Flonase Sensimist 2 sprays each nostril daily. Aim upward and outward. - Use Singulair 10mg  daily.  Stop if there are any mood/behavioral changes.  Silent GERD - Start Nexium 40mg  in the morning on an empty stomach. Eat a snack or meal about 30-45 minutes after.        Return in about 6 weeks (around 09/25/2023).  Alesia Morin, MD [facility]

## 2023-08-29 ENCOUNTER — Encounter: Payer: Self-pay | Admitting: Family Medicine

## 2023-09-25 ENCOUNTER — Ambulatory Visit: Payer: BC Managed Care – PPO | Admitting: Internal Medicine

## 2023-09-25 ENCOUNTER — Other Ambulatory Visit: Payer: Self-pay

## 2023-09-25 ENCOUNTER — Encounter: Payer: Self-pay | Admitting: Internal Medicine

## 2023-09-25 VITALS — BP 112/80 | HR 70 | Temp 98.3°F | Wt 235.3 lb

## 2023-09-25 DIAGNOSIS — J310 Chronic rhinitis: Secondary | ICD-10-CM

## 2023-09-25 DIAGNOSIS — K219 Gastro-esophageal reflux disease without esophagitis: Secondary | ICD-10-CM

## 2023-09-25 DIAGNOSIS — J4540 Moderate persistent asthma, uncomplicated: Secondary | ICD-10-CM | POA: Diagnosis not present

## 2023-09-25 DIAGNOSIS — R053 Chronic cough: Secondary | ICD-10-CM | POA: Diagnosis not present

## 2023-09-25 MED ORDER — BREZTRI AEROSPHERE 160-9-4.8 MCG/ACT IN AERO
2.0000 | INHALATION_SPRAY | Freq: Two times a day (BID) | RESPIRATORY_TRACT | 5 refills | Status: DC
Start: 2023-09-25 — End: 2023-11-13

## 2023-09-25 MED ORDER — ALBUTEROL SULFATE HFA 108 (90 BASE) MCG/ACT IN AERS: 2.0000 | INHALATION_SPRAY | Freq: Four times a day (QID) | RESPIRATORY_TRACT | 1 refills | Status: DC | PRN

## 2023-09-25 MED ORDER — ESOMEPRAZOLE MAGNESIUM 40 MG PO CPDR: 40.0000 mg | DELAYED_RELEASE_CAPSULE | Freq: Every day | ORAL | 5 refills | Status: AC

## 2023-09-25 NOTE — Patient Instructions (Addendum)
Moderate Persistent Asthma Chronic Cough - Normal Spirometry today. Cough improved with use of Breztri and Nexium.  For now will continue both.  She has an upcoming trip to [place] in December.  After Fall/Winter, we will consider de-escalation if still doing well.  - Avoid frequent throat clearing. Keep bottle of water or altoids-peppermint to help reset the cough reflex.  - Maintenance inhaler:   - Continue Breztri 160-9-4.43mcg 2 puffs twice daily with spacer.  - Continue Montelukast 10mg  daily. Stop if it causes any mood or behavioral changes.   - Rescue inhaler: Albuterol 2 puffs via spacer or 1 vial via nebulizer every 4-6 hours as needed for respiratory symptoms of cough, shortness of breath, or wheezing Asthma control goals:  Full participation in all desired activities (may need albuterol before activity) Albuterol use two times or less a week on average (not counting use with activity) Cough interfering with sleep two times or less a month Oral steroids no more than once a year No hospitalizations   Chronic Rhinitis: - Consider allergy testing whenever you have time in October.  I am giving the okay to overbook into an early morning or early afternoon slot with me for testing.  - Use nasal saline rinses before nose sprays such as with Neilmed Sinus Rinse.  Use distilled water.   - Use Flonase Sensimist 2 sprays each nostril daily. Aim upward and outward. - Use Singulair 10mg  daily.  Stop if there are any mood/behavioral changes.  Silent GERD - Continue Nexium 40mg  in the morning on an empty stomach.  Eat a snack or meal about 30-45 minutes after.      Follow up: October for testing when available.  I am giving the okay to overbook with me (Dr. Allena Katz) for  an early morning or early afternoon slot for testing.

## 2023-09-25 NOTE — Progress Notes (Signed)
FOLLOW UP Date of Service/Encounter:  09/25/23   Subjective:  Wanda Gross (DOB: 12/09/1965) is a 58 y.o. female who [facility] for follow up for chronic cough, asthma, rhinitis, reflux.   History obtained from: chart review and patient. Last visit was on 08/14/2023 with me for chronic cough and we started on Breztri to see if this helps. We also discussed starting treatment for possible silent reflux with Nexium.  In terms of rhinitis, wished to hold off on allergy testing- continue Flonase Sensimist and Singulair.   Reports cough is better since last visit.  She still has some cough but not as bad as before where she was vomiting from it.  Did get COVID around early September and at the time, had some worsening with the cough but now is doing better.  She is taking Breztri 2 puffs BID, Singulair daily and Flonase Sensimist daily.  Still having lots of post nasal drip also that has worsened with COVID.  Also taking Nexium which she thinks is helping. Has not had to use albuterol frequently.  No ER visits/oral prednisone since last visit.  Denies any overt symptoms of heartburn, reflux, sour taste in mouth Planning to go to [place] in December for a month.   Past Medical History: Past Medical History:  Diagnosis Date   Allergy    Asthma    Fibroid     Objective:  BP 112/80   Pulse 70   Temp 98.3 F (36.8 C) (Temporal)   Wt 235 lb 4.8 oz (106.7 kg)   SpO2 98%   BMI 37.98 kg/m  Body mass index is 37.98 kg/m. Physical Exam: GEN: alert, well developed HEENT: clear conjunctiva, nose with mild inferior turbinate hypertrophy, pink nasal mucosa, no rhinorrhea, no cobblestoning HEART: regular rate and rhythm, no murmur LUNGS: clear to auscultation bilaterally, no coughing, unlabored respiration SKIN: no rashes or lesions  Spirometry:  Tracings reviewed. Her effort: Good reproducible efforts. FVC: 2.51L FEV1: 2.15L, 93%  predicted FEV1/FVC ratio: 86% Interpretation: Spirometry consistent with normal pattern.  Please see scanned spirometry results for details.  Assessment:   1. Moderate persistent asthma without complication   2. Chronic cough   3. Chronic rhinitis   4. Gastroesophageal reflux disease, unspecified whether esophagitis present     Plan/Recommendations:  Moderate Persistent Asthma Chronic Cough - Normal Spirometry today, MDI teaching done.  She has an upcoming trip to [place] in December.  After Fall/Winter, we will consider de-escalation if still doing well.  - Avoid frequent throat clearing. Keep bottle of water or altoids-peppermint to help reset the cough reflex.  - Maintenance inhaler:   - Continue Breztri 160-9-4.82mcg 2 puffs twice daily with spacer.  - Continue Montelukast 10mg  daily. Stop if it causes any mood or behavioral changes.   - Rescue inhaler: Albuterol 2 puffs via spacer or 1 vial via nebulizer every 4-6 hours as needed for respiratory symptoms of cough, shortness of breath, or wheezing Asthma control goals:  Full participation in all desired activities (may need albuterol before activity) Albuterol use two times or less a week on average (not counting use with activity) Cough interfering with sleep two times or less a month Oral steroids no more than once a year No hospitalizations   Chronic Rhinitis: - Consider allergy testing whenever you have time in October.  I am giving the okay to overbook into an early morning or early afternoon slot with me for testing.  Hold anti  histamines 3 days prior.  - Use nasal saline rinses before nose sprays such as with Neilmed Sinus Rinse.  Use distilled water.   - Use Flonase Sensimist 2 sprays each nostril daily. Aim upward and outward. - Use Singulair 10mg  daily.  Stop if there are any mood/behavioral changes.  Silent GERD - Continue Nexium 40mg  in the morning on an empty stomach.  Eat a snack or meal about 30-45 minutes  after.      Follow up: October for testing when available.  I am giving the okay to overbook with me (Dr. Allena Katz) for  an early morning or early afternoon slot for testing.       No follow-ups on file.  Alesia Morin, MD [facility]

## 2023-11-13 ENCOUNTER — Other Ambulatory Visit: Payer: Self-pay

## 2023-11-13 ENCOUNTER — Other Ambulatory Visit (HOSPITAL_BASED_OUTPATIENT_CLINIC_OR_DEPARTMENT_OTHER): Payer: Self-pay

## 2023-11-13 ENCOUNTER — Encounter: Payer: Self-pay | Admitting: Internal Medicine

## 2023-11-13 ENCOUNTER — Ambulatory Visit: Payer: BC Managed Care – PPO | Admitting: Internal Medicine

## 2023-11-13 VITALS — BP 122/90 | HR 68 | Temp 97.6°F | Resp 16 | Ht 67.0 in | Wt 238.0 lb

## 2023-11-13 DIAGNOSIS — J4540 Moderate persistent asthma, uncomplicated: Secondary | ICD-10-CM | POA: Diagnosis not present

## 2023-11-13 DIAGNOSIS — Z881 Allergy status to other antibiotic agents status: Secondary | ICD-10-CM

## 2023-11-13 DIAGNOSIS — B379 Candidiasis, unspecified: Secondary | ICD-10-CM

## 2023-11-13 DIAGNOSIS — B9689 Other specified bacterial agents as the cause of diseases classified elsewhere: Secondary | ICD-10-CM

## 2023-11-13 DIAGNOSIS — J019 Acute sinusitis, unspecified: Secondary | ICD-10-CM | POA: Diagnosis not present

## 2023-11-13 MED ORDER — MONTELUKAST SODIUM 10 MG PO TABS
10.0000 mg | ORAL_TABLET | Freq: Every day | ORAL | 1 refills | Status: AC
  Filled 2023-11-13 – 2024-05-06 (×2): qty 90, 90d supply, fill #0

## 2023-11-13 MED ORDER — FLUCONAZOLE 150 MG PO TABS
150.0000 mg | ORAL_TABLET | Freq: Once | ORAL | 0 refills | Status: AC
Start: 2023-11-13 — End: 2023-11-14
  Filled 2023-11-13: qty 1, 1d supply, fill #0

## 2023-11-13 MED ORDER — BREZTRI AEROSPHERE 160-9-4.8 MCG/ACT IN AERO
2.0000 | INHALATION_SPRAY | Freq: Two times a day (BID) | RESPIRATORY_TRACT | 1 refills | Status: AC
  Filled 2023-11-13: qty 32.1, 90d supply, fill #0
  Filled 2024-05-06: qty 32.1, 90d supply, fill #1

## 2023-11-13 MED ORDER — ALBUTEROL SULFATE HFA 108 (90 BASE) MCG/ACT IN AERS
2.0000 | INHALATION_SPRAY | Freq: Four times a day (QID) | RESPIRATORY_TRACT | 1 refills | Status: AC | PRN
  Filled 2023-11-13: qty 6.7, 25d supply, fill #0

## 2023-11-13 MED ORDER — AMOXICILLIN-POT CLAVULANATE 875-125 MG PO TABS
1.0000 | ORAL_TABLET | Freq: Two times a day (BID) | ORAL | 0 refills | Status: DC
  Filled 2023-11-13: qty 14, 7d supply, fill #0

## 2023-11-13 NOTE — Progress Notes (Addendum)
FOLLOW UP Date of Service/Encounter:  11/13/23   Subjective:  Wanda Gross (DOB: [birth year]) is a 58 y.o. female who [facility] for follow up for an acute visit. Also with chronic cough, asthma, rhinitis and reflux.   History obtained from: chart review and patient. Last visit was with me on 09/25/2023 and at the time was doing well on Breztri with plans for allergy testing in the future.  Reports the past 2 weeks, she has had a cough with green mucoid drainage, lots of congestion, drainage, post nasal drip, sinus pressure, headaches.  Also has noted some wheezing/rattling sounds but denies much trouble with SOB.  Taking Breztri BID.  Thinks asthma is okay but has come down with a sinus infection. Usually gets a yeast infection with antibiotics.    Past Medical History: Past Medical History:  Diagnosis Date   Allergy    Asthma    Fibroid     Objective:  BP (!) 122/90 (BP Location: Right Arm, Patient Position: Sitting, Cuff Size: Large)   Pulse 68   Temp 97.6 F (36.4 C) (Temporal)   Resp 16   Ht 5\' 7"  (1.702 m)   Wt 238 lb (108 kg)   SpO2 99%   BMI 37.28 kg/m  Body mass index is 37.28 kg/m. Physical Exam: GEN: alert, well developed HEENT: clear conjunctiva, TM grey and translucent, nose with mild inferior turbinate hypertrophy, pink nasal mucosa, + mucoid rhinorrhea HEART: regular rate and rhythm, no murmur LUNGS: clear to auscultation bilaterally, + coughing, unlabored respiration SKIN: no rashes or lesions  Assessment:   1. Seasonal allergic rhinitis due to pollen     Plan/Recommendations:  Acute Bacterial Sinusitis Yeast Infection with anti bacterial use  - Start Augmentin 1 tablet twice daily for 7 days. - If you have trouble with yeast infection, start Diflucan 150mg  x1 dose.  Moderate Persistent Asthma Chronic Cough - Discussed no need for prednisone at this time; do think she has an URI rather than asthma  flare up.  Lung exam was normal.  - Normal Spirometry previously. Cough improved with use of Breztri and Nexium.  For now will continue both.  She has an upcoming trip to [place] in December.  After Fall/Winter, we will consider de-escalation if still doing well.  - Avoid frequent throat clearing. Keep bottle of water or altoids-peppermint to help reset the cough reflex.  - Maintenance inhaler:   - Continue Breztri 160-9-4.70mcg 2 puffs twice daily with spacer.  - Continue Montelukast 10mg  daily. Stop if it causes any mood or behavioral changes.  - Rescue inhaler: Albuterol 2 puffs via spacer or 1 vial via nebulizer every 4-6 hours as needed for respiratory symptoms of cough, shortness of breath, or wheezing Asthma control goals:  Full participation in all desired activities (may need albuterol before activity) Albuterol use two times or less a week on average (not counting use with activity) Cough interfering with sleep two times or less a month Oral steroids no more than once a year No hospitalizations  Chronic Rhinitis: - Consider allergy testing whenever you have time. I am giving the okay to overbook into an early morning or early afternoon slot with me for testing.  - Use nasal saline rinses before nose sprays such as with Neilmed Sinus Rinse.  Use distilled water.   - Use Flonase Sensimist 2 sprays each nostril daily. Aim upward and outward. - Use Singulair 10mg  daily.  Stop if there are  any mood/behavioral changes.  Silent GERD - Continue Nexium 40mg  in the morning on an empty stomach.  Eat a snack or meal about 30-45 minutes after.      Follow up after her trip.   Alesia Morin, MD [facility]

## 2023-11-13 NOTE — Patient Instructions (Addendum)
Acute Bacterial Sinusitis Yeast Infection with anti bacterial use  - Start Augmentin 1 tablet twice daily for 7 days. - If you have trouble with yeast infection, start Diflucan 150mg  x1 dose.  Moderate Persistent Asthma Chronic Cough - Normal Spirometry previously. Cough improved with use of Breztri and Nexium.  For now will continue both.  She has an upcoming trip to [place] in December.  After Fall/Winter, we will consider de-escalation if still doing well.  - Avoid frequent throat clearing. Keep bottle of water or altoids-peppermint to help reset the cough reflex.  - Maintenance inhaler:   - Continue Breztri 160-9-4.46mcg 2 puffs twice daily with spacer.  - Continue Montelukast 10mg  daily. Stop if it causes any mood or behavioral changes.  - Rescue inhaler: Albuterol 2 puffs via spacer or 1 vial via nebulizer every 4-6 hours as needed for respiratory symptoms of cough, shortness of breath, or wheezing Asthma control goals:  Full participation in all desired activities (may need albuterol before activity) Albuterol use two times or less a week on average (not counting use with activity) Cough interfering with sleep two times or less a month Oral steroids no more than once a year No hospitalizations  Chronic Rhinitis: - Consider allergy testing whenever you have time. I am giving the okay to overbook into an early morning or early afternoon slot with me for testing.  - Use nasal saline rinses before nose sprays such as with Neilmed Sinus Rinse.  Use distilled water.   - Use Flonase Sensimist 2 sprays each nostril daily. Aim upward and outward. - Use Singulair 10mg  daily.  Stop if there are any mood/behavioral changes.  Silent GERD - Continue Nexium 40mg  in the morning on an empty stomach.  Eat a snack or meal about 30-45 minutes after.

## 2023-11-18 ENCOUNTER — Other Ambulatory Visit (HOSPITAL_BASED_OUTPATIENT_CLINIC_OR_DEPARTMENT_OTHER): Payer: Self-pay

## 2023-11-18 ENCOUNTER — Encounter: Payer: Self-pay | Admitting: Family Medicine

## 2023-11-18 ENCOUNTER — Ambulatory Visit: Payer: BC Managed Care – PPO | Admitting: Family Medicine

## 2023-11-18 VITALS — BP 118/70 | HR 75 | Temp 98.0°F | Ht 67.0 in | Wt 234.4 lb

## 2023-11-18 DIAGNOSIS — J4540 Moderate persistent asthma, uncomplicated: Secondary | ICD-10-CM

## 2023-11-18 DIAGNOSIS — J301 Allergic rhinitis due to pollen: Secondary | ICD-10-CM | POA: Diagnosis not present

## 2023-11-18 DIAGNOSIS — J0100 Acute maxillary sinusitis, unspecified: Secondary | ICD-10-CM | POA: Diagnosis not present

## 2023-11-18 DIAGNOSIS — Z23 Encounter for immunization: Secondary | ICD-10-CM | POA: Diagnosis not present

## 2023-11-18 MED ORDER — AMOXICILLIN-POT CLAVULANATE 875-125 MG PO TABS
1.0000 | ORAL_TABLET | Freq: Two times a day (BID) | ORAL | 0 refills | Status: AC
  Filled 2023-11-18: qty 20, 10d supply, fill #0

## 2023-11-18 MED ORDER — FLUCONAZOLE 150 MG PO TABS
ORAL_TABLET | ORAL | 0 refills | Status: DC
  Filled 2023-11-18: qty 2, 3d supply, fill #0

## 2023-11-18 MED ORDER — PREDNISONE 10 MG PO TABS
ORAL_TABLET | ORAL | 0 refills | Status: AC
  Filled 2023-11-18: qty 21, 8d supply, fill #0

## 2023-11-18 NOTE — Progress Notes (Signed)
Subjective  CC:  Chief Complaint  Patient presents with   Sinus Problem    Pt has been having some  sinus issues with a lot of mucous and cough for the past 3 weeks. She is working with an Proofreader as well.     HPI: Wanda Gross is a 58 y.o. female who presents to the office today to address the problems listed above in the chief complaint. Discussed the use of AI scribe software for clinical note transcription with the patient, who gave verbal consent to proceed.  History of Present Illness   The patient, with a history of asthma and sinusitis, presents with a persistent cough and significant mucus production, which has been causing discomfort and sleep disturbances. The cough is associated with some wheezing and the patient has been producing copious amounts of mucus. The patient denies any chest tightness.  The patient has been on a course of amoxicillin for a sinus infection, which seems to be improving after five days of treatment. However, the patient reports that the mucus drainage is hitting the back of her throat, causing discomfort and contributing to the cough. The patient has not been taking any medications such as Mucinex to manage the mucus production.  The patient has a history of responding well to prednisone for similar symptoms in the past, but has not had any this fall. The last time the patient had prednisone was in the spring or winter of the previous year. The patient is planning to travel for fourteen days starting from the fifteenth of December.    Reviewed recent notes from allergist  Assessment  1. Acute non-recurrent maxillary sinusitis   2. Seasonal allergic rhinitis due to pollen   3. Moderate persistent asthma without complication   4. Need for influenza vaccination      Plan  Sinus infection w/ allergies and h/o asthms:  no asthma exacerbation; has persistent thick drainage and long h/o cough on breztri and nexium Assessment and Plan    Cough with  Mucus Production Persistent cough and significant mucus production despite five days of amoxicillin for sinus infection. No chest tightness, some wheezing noted. Likely due to thick mucus drainage, not asthma exacerbation. Discussed starting Mucinex DM and continuing antibiotics. Prednisone taper prescribed to reduce inflammation and improve symptoms. - Start Mucinex DM twice daily - Continue amoxicillin - Prescribe prednisone taper  Sinus Infection Day five of amoxicillin treatment for sinus infection. Reports symptom improvement, indicating response to antibiotics. - Continue amoxicillin  General Health Maintenance Due for flu shot and plans to travel for holidays. - Administer flu shot  Follow-up - Contact office if symptoms do not improve before trip.      I sent in augmenting and extra prednisone taper so she can take to [place] if needed.  Follow up: June for cpe Visit date not found  Orders Placed This Encounter  Procedures   Flu vaccine trivalent PF, 6mos and older(Flulaval,Afluria,Fluarix,Fluzone)   Meds ordered this encounter  Medications   amoxicillin-clavulanate (AUGMENTIN) 875-125 MG tablet    Sig: Take 1 tablet by mouth 2 (two) times daily for 10 days.    Dispense:  20 tablet    Refill:  0   predniSONE (DELTASONE) 10 MG tablet    Sig: Take 4 tabs qd x 2 days, 3 qd x 2 days, 2 qd x 2d, 1qd x 3 days    Dispense:  21 tablet    Refill:  1   fluconazole (DIFLUCAN) 150 MG  tablet    Sig: Take one tablet today; may repeat in 3 days if symptoms persist    Dispense:  2 tablet    Refill:  0      I reviewed the patients updated PMH, FH, and SocHx.    Patient Active Problem List   Diagnosis Date Noted   Moderate persistent asthma without complication 06/07/2023    Priority: High   Family history of colon cancer 06/17/2023    Priority: Medium    Seasonal allergic rhinitis due to pollen 03/21/2018    Priority: Medium    Obesity (BMI 30-39.9) 02/07/2018     Priority: Medium    Vitamin D deficiency 02/07/2018    Priority: Low   Current Meds  Medication Sig   albuterol (VENTOLIN HFA) 108 (90 Base) MCG/ACT inhaler Inhale 2 puffs into the lungs every 6 (six) hours as needed for wheezing or shortness of breath.   amoxicillin-clavulanate (AUGMENTIN) 875-125 MG tablet Take 1 tablet by mouth 2 (two) times daily for 10 days.   Budeson-Glycopyrrol-Formoterol (BREZTRI AEROSPHERE) 160-9-4.8 MCG/ACT AERO Inhale 2 puffs into the lungs in the morning and at bedtime.   Cholecalciferol 1.25 MG (50000 UT) capsule Take 50,000 Units by mouth daily.   esomeprazole (NEXIUM) 40 MG capsule Take 1 capsule (40 mg total) by mouth daily.   fluconazole (DIFLUCAN) 150 MG tablet Take one tablet today; may repeat in 3 days if symptoms persist   fluticasone (FLONASE) 50 MCG/ACT nasal spray 1 spray nasally twice daily until symptoms resolve   levocetirizine (XYZAL ALLERGY 24HR) 5 MG tablet Take 1 tablet (5 mg total) by mouth every evening.   montelukast (SINGULAIR) 10 MG tablet Take 1 tablet (10 mg total) by mouth at bedtime.   predniSONE (DELTASONE) 10 MG tablet Take 4 tabs qd x 2 days, 3 qd x 2 days, 2 qd x 2d, 1qd x 3 days   triamcinolone cream (KENALOG) 0.1 % Apply 1 Application topically 2 (two) times daily.   [DISCONTINUED] amoxicillin-clavulanate (AUGMENTIN) 875-125 MG tablet Take 1 tablet by mouth 2 (two) times daily.    Allergies: Patient has No Known Allergies. Family History: Patient family history includes Alcohol abuse in her paternal grandfather; Allergic rhinitis in her maternal grandmother and mother; Breast cancer in her maternal aunt and paternal aunt; COPD in her mother; Colon cancer (age of onset: 88) in her brother; Congestive Heart Failure in her father; Healthy in her brother and daughter; Hypertension in her father and mother; Hypothyroidism in her father and sister; Kidney failure in her mother; Lung cancer in her maternal grandmother; Ovarian cancer in  her paternal grandmother. Social History:  Patient  reports that she has never smoked. She has never been exposed to tobacco smoke. She has never used smokeless tobacco. She reports current alcohol use of about 2.0 standard drinks of alcohol per week. She reports that she does not use drugs.  Review of Systems: Constitutional: Negative for fever malaise or anorexia Cardiovascular: negative for chest pain Respiratory: negative for SOB or persistent cough Gastrointestinal: negative for abdominal pain  Objective  Vitals: BP 118/70   Pulse 75   Temp 98 F (36.7 C)   Ht 5\' 7"  (1.702 m)   Wt 234 lb 6.4 oz (106.3 kg)   SpO2 98%   BMI 36.71 kg/m  General: no acute distress , A&Ox3 HEENT: PEERL, conjunctiva normal, neck is supple Cardiovascular:  RRR without murmur or gallop.  Respiratory:  Good breath sounds bilaterally, CTAB with normal respiratory effort, no  wheezing Skin:  Warm, no rashes  Commons side effects, risks, benefits, and alternatives for medications and treatment plan prescribed today were discussed, and the patient expressed understanding of the given instructions. Patient is instructed to call or message via MyChart if he/she has any questions or concerns regarding our treatment plan. No barriers to understanding were identified. We discussed Red Flag symptoms and signs in detail. Patient expressed understanding regarding what to do in case of urgent or emergency type symptoms.  Medication list was reconciled, printed and provided to the patient in AVS. Patient instructions and summary information was reviewed with the patient as documented in the AVS. This note was prepared with assistance of Dragon voice recognition software. Occasional wrong-word or sound-a-like substitutions may have occurred due to the inherent limitations of voice recognition software

## 2023-11-18 NOTE — Patient Instructions (Signed)
Please return in June for your annual complete physical; please come fasting.   If you have any questions or concerns, please don't hesitate to send me a message via MyChart or call the office at [phone]. Thank you for visiting with [place] today! It's our pleasure caring for you.   VISIT SUMMARY:  During today's visit, we discussed your persistent cough and mucus production, which have been causing you discomfort and sleep disturbances. We also reviewed your ongoing treatment for a sinus infection and your general health maintenance needs.  YOUR PLAN:  -COUGH WITH MUCUS PRODUCTION: Your persistent cough and mucus production are likely due to thick mucus drainage rather than an asthma exacerbation. To help manage these symptoms, you should start taking Mucinex DM twice daily, continue your current course of amoxicillin, and begin a prednisone taper to reduce inflammation and improve your symptoms.  -SINUS INFECTION: You are on day five of your amoxicillin treatment for a sinus infection, and you have reported some improvement in your symptoms. This indicates that the antibiotics are working. Please continue taking the amoxicillin as prescribed.  - I have sent in an antibiotic, a diflucan and a prednisone taper refill to take with you to [place], just in case you need it.   -GENERAL HEALTH MAINTENANCE: You are due for a flu shot, which is important for preventing influenza, especially as you plan to travel for the holidays. We will administer the flu shot today.  INSTRUCTIONS:  Please contact our office if your symptoms do not improve before your trip.

## 2023-12-03 ENCOUNTER — Other Ambulatory Visit (HOSPITAL_BASED_OUTPATIENT_CLINIC_OR_DEPARTMENT_OTHER): Payer: Self-pay

## 2023-12-12 ENCOUNTER — Encounter: Payer: Self-pay | Admitting: Family Medicine

## 2024-05-06 ENCOUNTER — Other Ambulatory Visit: Payer: Self-pay | Admitting: Family Medicine

## 2024-05-06 ENCOUNTER — Other Ambulatory Visit (HOSPITAL_BASED_OUTPATIENT_CLINIC_OR_DEPARTMENT_OTHER): Payer: Self-pay

## 2024-05-06 DIAGNOSIS — Z1231 Encounter for screening mammogram for malignant neoplasm of breast: Secondary | ICD-10-CM

## 2024-05-12 ENCOUNTER — Other Ambulatory Visit (HOSPITAL_BASED_OUTPATIENT_CLINIC_OR_DEPARTMENT_OTHER): Payer: Self-pay

## 2024-05-12 MED ORDER — TRIAMCINOLONE ACETONIDE 0.1 % EX OINT
1.0000 | TOPICAL_OINTMENT | Freq: Every day | CUTANEOUS | 0 refills | Status: DC
  Filled 2024-05-12: qty 80, 80d supply, fill #0

## 2024-05-12 MED ORDER — TACROLIMUS 0.1 % EX OINT
1.0000 | TOPICAL_OINTMENT | Freq: Two times a day (BID) | CUTANEOUS | 2 refills | Status: DC
  Filled 2024-05-12: qty 60, 30d supply, fill #0

## 2024-05-14 ENCOUNTER — Other Ambulatory Visit (HOSPITAL_BASED_OUTPATIENT_CLINIC_OR_DEPARTMENT_OTHER): Payer: Self-pay

## 2024-05-18 ENCOUNTER — Other Ambulatory Visit (HOSPITAL_BASED_OUTPATIENT_CLINIC_OR_DEPARTMENT_OTHER): Payer: Self-pay

## 2024-05-26 ENCOUNTER — Ambulatory Visit: Admitting: Podiatry

## 2024-05-26 ENCOUNTER — Encounter: Payer: Self-pay | Admitting: Podiatry

## 2024-05-26 DIAGNOSIS — B351 Tinea unguium: Secondary | ICD-10-CM

## 2024-05-26 DIAGNOSIS — L603 Nail dystrophy: Secondary | ICD-10-CM

## 2024-05-26 NOTE — Progress Notes (Unsigned)
  Subjective:  Patient ID: Wanda Gross, female    DOB: 03/07/1965,  MRN: [id number]  Chief Complaint  Patient presents with   Nail Problem    RM#13 right foot big toe nail splint keeps growing back in the same form.    Discussed the use of AI scribe software for clinical note transcription with the patient, who gave verbal consent to proceed.  History of Present Illness Wanda Gross is a 59 year old female who presents with a vertical split in her big toenail.  The vertical split in her big toenail has persisted despite professional pedicures and personal trimming efforts. Pain occurs when bending her foot back during activities like yoga, with increased discomfort as the nail grows. There is no drainage or redness. The toenail is discolored and feels loose. She has not attempted any home treatments specifically for the toenail. She recently started taking a biotin supplement, primarily for hair health, which may also benefit her nails.      Objective:    Physical Exam General: AAO x3, NAD  Dermatological: On the hallux toenail there is a vertical split with the distal portion of the nail.  The distal proximal nail appears to be mildly hypertrophic and has yellow, brown discoloration.  There is no edema, erythema or signs of infection noted today.  Vascular: Dorsalis Pedis artery and Posterior Tibial artery pedal pulses are 2/4 bilateral with immedate capillary fill time. There is no pain with calf compression, swelling, warmth, erythema.   Neruologic: Grossly intact via light touch bilateral.   Musculoskeletal: No pain on exam today.  Gait: Unassisted, Nonantalgic.     No images are attached to the encounter.    Results Procedure: Toenail culture Description: Trimmed the toenail and collected a sample for culture to identify potential fungal or bacterial infection.   Assessment:   1. Dermatophytosis of nail   2. Splitting of nail      Plan:  Patient was  evaluated and treated and all questions answered.  Assessment and Plan Assessment & Plan Onychodystrophy Chronic vertical split in the big toenail with discoloration and loosening. Differential includes trauma or fungal infection. - Culture toenail - sent to [facility]  - Continue biotin supplementation. - Recommend nail strengthener to prevent splitting. - Trim toenail to prevent rubbing during activities. - Send MyChart message with culture results.   Return for nail culture results.    Charity Conch DPM

## 2024-06-02 ENCOUNTER — Encounter: Payer: BC Managed Care – PPO | Admitting: Family Medicine

## 2024-06-05 ENCOUNTER — Other Ambulatory Visit: Payer: Self-pay | Admitting: Podiatry

## 2024-06-09 ENCOUNTER — Ambulatory Visit: Payer: Self-pay | Admitting: Podiatry

## 2024-06-18 ENCOUNTER — Encounter: Payer: Self-pay | Admitting: Family Medicine

## 2024-06-18 ENCOUNTER — Ambulatory Visit (INDEPENDENT_AMBULATORY_CARE_PROVIDER_SITE_OTHER): Payer: BC Managed Care – PPO | Admitting: Family Medicine

## 2024-06-18 VITALS — BP 129/71 | HR 61 | Temp 97.7°F | Ht 67.0 in | Wt 240.0 lb

## 2024-06-18 DIAGNOSIS — E559 Vitamin D deficiency, unspecified: Secondary | ICD-10-CM

## 2024-06-18 DIAGNOSIS — J301 Allergic rhinitis due to pollen: Secondary | ICD-10-CM

## 2024-06-18 DIAGNOSIS — Z23 Encounter for immunization: Secondary | ICD-10-CM | POA: Diagnosis not present

## 2024-06-18 DIAGNOSIS — J4540 Moderate persistent asthma, uncomplicated: Secondary | ICD-10-CM

## 2024-06-18 DIAGNOSIS — Z0001 Encounter for general adult medical examination with abnormal findings: Secondary | ICD-10-CM

## 2024-06-18 DIAGNOSIS — Z0000 Encounter for general adult medical examination without abnormal findings: Secondary | ICD-10-CM

## 2024-06-18 DIAGNOSIS — E669 Obesity, unspecified: Secondary | ICD-10-CM | POA: Diagnosis not present

## 2024-06-18 DIAGNOSIS — Z800 Family history of malignant neoplasm of digestive organs: Secondary | ICD-10-CM | POA: Diagnosis not present

## 2024-06-18 LAB — CBC WITH DIFFERENTIAL/PLATELET
Basophils Absolute: 0 10*3/uL (ref 0.0–0.1)
Basophils Relative: 0.9 % (ref 0.0–3.0)
Eosinophils Absolute: 0.2 10*3/uL (ref 0.0–0.7)
Eosinophils Relative: 3.1 % (ref 0.0–5.0)
HCT: 37.7 % (ref 36.0–46.0)
Hemoglobin: 12 g/dL (ref 12.0–15.0)
Lymphocytes Relative: 37.2 % (ref 12.0–46.0)
Lymphs Abs: 1.9 10*3/uL (ref 0.7–4.0)
MCHC: 31.9 g/dL (ref 30.0–36.0)
MCV: 84.5 fl (ref 78.0–100.0)
Monocytes Absolute: 0.5 10*3/uL (ref 0.1–1.0)
Monocytes Relative: 10.6 % (ref 3.0–12.0)
Neutro Abs: 2.4 10*3/uL (ref 1.4–7.7)
Neutrophils Relative %: 48.2 % (ref 43.0–77.0)
Platelets: 237 10*3/uL (ref 150.0–400.0)
RBC: 4.46 Mil/uL (ref 3.87–5.11)
RDW: 15 % (ref 11.5–15.5)
WBC: 5 10*3/uL (ref 4.0–10.5)

## 2024-06-18 LAB — COMPREHENSIVE METABOLIC PANEL WITH GFR
ALT: 15 U/L (ref 0–35)
AST: 16 U/L (ref 0–37)
Albumin: 4.2 g/dL (ref 3.5–5.2)
Alkaline Phosphatase: 75 U/L (ref 39–117)
BUN: 18 mg/dL (ref 6–23)
CO2: 30 meq/L (ref 19–32)
Calcium: 9.6 mg/dL (ref 8.4–10.5)
Chloride: 102 meq/L (ref 96–112)
Creatinine, Ser: 0.71 mg/dL (ref 0.40–1.20)
GFR: 92.78 mL/min (ref >60.00)
Glucose, Bld: 91 mg/dL (ref 70–99)
Potassium: 4 meq/L (ref 3.5–5.1)
Sodium: 137 meq/L (ref 135–145)
Total Bilirubin: 0.6 mg/dL (ref 0.2–1.2)
Total Protein: 7.8 g/dL (ref 6.0–8.3)

## 2024-06-18 LAB — LIPID PANEL
Cholesterol: 168 mg/dL (ref 0–200)
HDL: 62 mg/dL (ref >39.00)
LDL Cholesterol: 91 mg/dL (ref 0–99)
NonHDL: 106.11
Total CHOL/HDL Ratio: 3
Triglycerides: 76 mg/dL (ref 0.0–149.0)
VLDL: 15.2 mg/dL (ref 0.0–40.0)

## 2024-06-18 LAB — TSH: TSH: 1.83 u[IU]/mL (ref 0.35–5.50)

## 2024-06-18 LAB — VITAMIN D 25 HYDROXY (VIT D DEFICIENCY, FRACTURES): VITD: 24.58 ng/mL — ABNORMAL LOW (ref 30.00–100.00)

## 2024-06-18 NOTE — Progress Notes (Signed)
 Subjective  Chief Complaint  Patient presents with   Annual Exam    Pt here for Annual Exam and is currently fasting     HPI: Wanda Gross is a 59 y.o. female who presents to [facility] [place] today for a Female Wellness Visit. She also has the concerns and/or needs as listed above in the chief complaint. These will be addressed in addition to the Health Maintenance Visit.   Wellness Visit: annual visit with health maintenance review and exam  HM: due colonoscopy this November. + Fh of colon cancer. Eligible for prevnar 20. Other screens current. Mammo scheduled later this month. GYN for female wellness. Doing well.  Spent a month in [place] with her husband and his family.  Overall good experience. Chronic disease f/u and/or acute problem visit: (deemed necessary to be done in addition to the wellness visit): Persistent asthma is doing well without complications or recent exacerbations.  Continues on inhaler, maintenance and rescue intermittently.  Eligible for Prevnar 20 Multiple allergy medicines which are well-controlled.  Sees an allergist Assessment  1. Encounter for well adult exam with abnormal findings   2. Moderate persistent asthma without complication   3. Family history of colon cancer   4. Obesity (BMI 30-39.9)   5. Seasonal allergic rhinitis due to pollen   6. Vitamin D  deficiency   7. Need for pneumococcal 20-valent conjugate vaccination      Plan  Female Wellness Visit: Age appropriate Health Maintenance and Prevention measures were discussed with patient. Included topics are cancer screening recommendations, ways to keep healthy (see AVS) including dietary and exercise recommendations, regular eye and dental care, use of seat belts, and avoidance of moderate alcohol use and tobacco use.  Patient to call GI to schedule do colonoscopy.  Mammogram is scheduled next week.  Will have follow-up with GYN BMI: discussed patient's BMI and encouraged  positive lifestyle modifications to help get to or maintain a target BMI. HM needs and immunizations were addressed and ordered. See below for orders. See HM and immunization section for updates.  Prevnar 20 given in office today Routine labs and screening tests ordered including cmp, cbc and lipids where appropriate. Discussed recommendations regarding Vit D and calcium supplementation (see AVS)  Chronic disease management visit and/or acute problem visit: Allergies and asthma are well-controlled on current medications. Recheck vitamin D  Follow up: 1 year for complete physical Orders Placed This Encounter  Procedures   Pneumococcal conjugate vaccine 20-valent (Prevnar 20)   VITAMIN D  25 Hydroxy (Vit-D Deficiency, Fractures)   CBC with Differential/Platelet   Comprehensive metabolic panel with GFR   Lipid panel   TSH   No orders of the defined types were placed in this encounter.     Body mass index is 37.59 kg/m. Wt Readings from Last 3 Encounters:  06/18/24 240 lb (108.9 kg)  11/18/23 234 lb 6.4 oz (106.3 kg)  11/13/23 238 lb (108 kg)     Patient Active Problem List   Diagnosis Date Noted   Moderate persistent asthma without complication 06/07/2023    Priority: High   Family history of colon cancer 06/17/2023    Priority: Medium     Brother, stage 4 dxd 05/2023, age 46    Seasonal allergic rhinitis due to pollen 03/21/2018    Priority: Medium     Sees allergist    Obesity (BMI 30-39.9) 02/07/2018    Priority: Medium    Vitamin D  deficiency 02/07/2018    Priority:  Low   Health Maintenance  Topic Date Due   Pneumococcal Vaccine 84-39 Years old (1 of 2 - PCV) Never done   COVID-19 Vaccine (5 - 2024-25 season) 07/04/2024 (Originally 09/01/2023)   MAMMOGRAM  06/19/2024   INFLUENZA VACCINE  07/31/2024   Colonoscopy  11/19/2024   Cervical Cancer Screening (HPV/Pap Cotest)  03/22/2025   DTaP/Tdap/Td (2 - Td or Tdap) 06/16/2033   Hepatitis C Screening  Completed   HIV  Screening  Completed   Zoster Vaccines- Shingrix   Completed   HPV VACCINES  Aged Out   Meningococcal B Vaccine  Aged Out   Immunization History  Administered Date(s) Administered   Influenza, Seasonal, Injecte, Preservative Fre 11/18/2023   Influenza,inj,Quad PF,6+ Mos 12/11/2018   Influenza-Unspecified 10/22/2017, 10/13/2021   Moderna Sars-Covid-2 Vaccination 02/22/2020, 03/29/2020, 09/28/2020, 11/10/2021   Tdap 06/17/2023   Zoster Recombinant(Shingrix ) 04/05/2022, 06/17/2023   We updated and reviewed the patient's past history in detail and it is documented below. Allergies: Patient has no known allergies. Past Medical History Patient  has a past medical history of Allergy, Asthma, and Fibroid. Past Surgical History Patient  has a past surgical history that includes Wisdom tooth extraction and Dilatation & currettage/hysteroscopy with novasure ablation (N/A, 07/24/2013). Family History: Patient family history includes Alcohol abuse in her paternal grandfather; Allergic rhinitis in her maternal grandmother and mother; Breast cancer in her maternal aunt and paternal aunt; COPD in her mother; Colon cancer (age of onset: 69) in her brother; Congestive Heart Failure in her father; Healthy in her brother and daughter; Hypertension in her father and mother; Hypothyroidism in her father and sister; Kidney failure in her mother; Lung cancer in her maternal grandmother; Ovarian cancer in her paternal grandmother. Social History:  Patient  reports that she has never smoked. She has never been exposed to tobacco smoke. She has never used smokeless tobacco. She reports current alcohol use of about 2.0 standard drinks of alcohol per week. She reports that she does not use drugs.  Review of Systems: Constitutional: negative for fever or malaise Ophthalmic: negative for photophobia, double vision or loss of vision Cardiovascular: negative for chest pain, dyspnea on exertion, or new LE  swelling Respiratory: negative for SOB or persistent cough Gastrointestinal: negative for abdominal pain, change in bowel habits or melena Genitourinary: negative for dysuria or gross hematuria, no abnormal uterine bleeding or disharge Musculoskeletal: negative for new gait disturbance or muscular weakness Integumentary: negative for new or persistent rashes, no breast lumps Neurological: negative for TIA or stroke symptoms Psychiatric: negative for SI or delusions Allergic/Immunologic: negative for hives  Patient Care Team    Relationship Specialty Notifications Start End  Luevenia Saha, MD PCP - General Family Medicine  08/14/23   Renea Carrion, MD Consulting Physician Obstetrics and Gynecology  03/21/18   Kandice Orleans, MD Consulting Physician Allergy and Immunology  08/14/23   Tami Falcon, MD Consulting Physician Gastroenterology  06/18/24     Objective  Vitals: BP 129/71   Pulse 61   Temp 97.7 F (36.5 C)   Ht 5' 7 (1.702 m)   Wt 240 lb (108.9 kg)   SpO2 99%   BMI 37.59 kg/m  General:  Well developed, well nourished, no acute distress  Psych:  Alert and orientedx3,normal mood and affect HEENT:  Normocephalic, atraumatic, non-icteric sclera,  supple neck without adenopathy, mass or thyromegaly Cardiovascular:  Normal S1, S2, RRR without gallop, rub or murmur Respiratory:  Good breath sounds bilaterally, CTAB with normal respiratory effort Gastrointestinal:  normal bowel sounds, soft, non-tender, no noted masses. No HSM MSK: extremities without edema, joints without erythema or swelling Neurologic:    Mental status is normal.  Gross motor and sensory exams are normal.  No tremor  Commons side effects, risks, benefits, and alternatives for medications and treatment plan prescribed today were discussed, and the patient expressed understanding of the given instructions. Patient is instructed to call or message via MyChart if he/she has any questions or concerns regarding our  treatment plan. No barriers to understanding were identified. We discussed Red Flag symptoms and signs in detail. Patient expressed understanding regarding what to do in case of urgent or emergency type symptoms.  Medication list was reconciled, printed and provided to the patient in AVS. Patient instructions and summary information was reviewed with the patient as documented in the AVS. This note was prepared with assistance of Dragon voice recognition software. Occasional wrong-word or sound-a-like substitutions may have occurred due to the inherent limitations of voice recognition software

## 2024-06-18 NOTE — Patient Instructions (Signed)
 Please return in 12 months for your annual complete physical; please come fasting.   I will release your lab results to you on your MyChart account with further instructions. You may see the results before I do, but when I review them I will send you a message with my report or have my assistant call you if things need to be discussed. Please reply to my message with any questions. Thank you!   If you have any questions or concerns, please don't hesitate to send me a message via MyChart or call the office at [phone]. Thank you for visiting with [place] today! It's our pleasure caring for you.   Preventive Care 60-60 Years Old, Female Preventive care refers to lifestyle choices and visits with your health care provider that can promote health and wellness. Preventive care visits are also called wellness exams. What can I expect for my preventive care visit? Counseling Your health care provider may ask you questions about your: Medical history, including: Past medical problems. Family medical history. Pregnancy history. Current health, including: Menstrual cycle. Method of birth control. Emotional well-being. Home life and relationship well-being. Sexual activity and sexual health. Lifestyle, including: Alcohol, nicotine or tobacco, and drug use. Access to firearms. Diet, exercise, and sleep habits. Work and work Astronomer. Sunscreen use. Safety issues such as seatbelt and bike helmet use. Physical exam Your health care provider will check your: Height and weight. These may be used to calculate your BMI (body mass index). BMI is a measurement that tells if you are at a healthy weight. Waist circumference. This measures the distance around your waistline. This measurement also tells if you are at a healthy weight and may help predict your risk of certain diseases, such as type 2 diabetes and high blood pressure. Heart rate and blood pressure. Body temperature. Skin for abnormal  spots. What immunizations do I need?  Vaccines are usually given at various ages, according to a schedule. Your health care provider will recommend vaccines for you based on your age, medical history, and lifestyle or other factors, such as travel or where you work. What tests do I need? Screening Your health care provider may recommend screening tests for certain conditions. This may include: Lipid and cholesterol levels. Diabetes screening. This is done by checking your blood sugar (glucose) after you have not eaten for a while (fasting). Pelvic exam and Pap test. Hepatitis B test. Hepatitis C test. HIV (human immunodeficiency virus) test. STI (sexually transmitted infection) testing, if you are at risk. Lung cancer screening. Colorectal cancer screening. Mammogram. Talk with your health care provider about when you should start having regular mammograms. This may depend on whether you have a family history of breast cancer. BRCA-related cancer screening. This may be done if you have a family history of breast, ovarian, tubal, or peritoneal cancers. Bone density scan. This is done to screen for osteoporosis. Talk with your health care provider about your test results, treatment options, and if necessary, the need for more tests. Follow these instructions at home: Eating and drinking  Eat a diet that includes fresh fruits and vegetables, whole grains, lean protein, and low-fat dairy products. Take vitamin and mineral supplements as recommended by your health care provider. Do not drink alcohol if: Your health care provider tells you not to drink. You are pregnant, may be pregnant, or are planning to become pregnant. If you drink alcohol: Limit how much you have to 0-1 drink a day. Know how much alcohol is in your  drink. In the [place] one drink equals one 12 oz bottle of beer (355 mL), one 5 oz glass of wine (148 mL), or one 1 oz glass of hard liquor (44 mL). Lifestyle Brush your  teeth every morning and night with fluoride toothpaste. Floss one time each day. Exercise for at least 30 minutes 5 or more days each week. Do not use any products that contain nicotine or tobacco. These products include cigarettes, chewing tobacco, and vaping devices, such as e-cigarettes. If you need help quitting, ask your health care provider. Do not use drugs. If you are sexually active, practice safe sex. Use a condom or other form of protection to prevent STIs. If you do not wish to become pregnant, use a form of birth control. If you plan to become pregnant, see your health care provider for a prepregnancy visit. Take aspirin only as told by your health care provider. Make sure that you understand how much to take and what form to take. Work with your health care provider to find out whether it is safe and beneficial for you to take aspirin daily. Find healthy ways to manage stress, such as: Meditation, yoga, or listening to music. Journaling. Talking to a trusted person. Spending time with friends and family. Minimize exposure to UV radiation to reduce your risk of skin cancer. Safety Always wear your seat belt while driving or riding in a vehicle. Do not drive: If you have been drinking alcohol. Do not ride with someone who has been drinking. When you are tired or distracted. While texting. If you have been using any mind-altering substances or drugs. Wear a helmet and other protective equipment during sports activities. If you have firearms in your house, make sure you follow all gun safety procedures. Seek help if you have been physically or sexually abused. What's next? Visit your health care provider once a year for an annual wellness visit. Ask your health care provider how often you should have your eyes and teeth checked. Stay up to date on all vaccines. This information is not intended to replace advice given to you by your health care provider. Make sure you discuss any  questions you have with your health care provider. Document Revised: 06/14/2021 Document Reviewed: 06/14/2021 Elsevier Patient Education  2024 [employer].

## 2024-06-19 ENCOUNTER — Other Ambulatory Visit (HOSPITAL_BASED_OUTPATIENT_CLINIC_OR_DEPARTMENT_OTHER): Payer: Self-pay

## 2024-06-19 ENCOUNTER — Other Ambulatory Visit: Payer: Self-pay | Admitting: Podiatry

## 2024-06-19 MED ORDER — CICLOPIROX 8 % EX SOLN
Freq: Every day | CUTANEOUS | 2 refills | Status: DC
  Filled 2024-06-19: qty 6.6, 30d supply, fill #0

## 2024-06-20 ENCOUNTER — Ambulatory Visit: Payer: Self-pay | Admitting: Family Medicine

## 2024-06-20 NOTE — Progress Notes (Signed)
 Labs reviewed.  The 10-year ASCVD risk score (Arnett DK, et al., 2019) is: 3.8%   Values used to calculate the score:     Age: 59 years     Clincally relevant sex: Female     Is Non-Hispanic African American: Yes     Diabetic: No     Tobacco smoker: No     Systolic Blood Pressure: 129 mmHg     Is BP treated: No     HDL Cholesterol: 62 mg/dL     Total Cholesterol: 168 mg/dL

## 2024-06-22 ENCOUNTER — Telehealth: Payer: Self-pay

## 2024-06-22 ENCOUNTER — Other Ambulatory Visit: Payer: Self-pay | Admitting: Podiatry

## 2024-06-22 MED ORDER — EFINACONAZOLE 10 % EX SOLN: 1.0000 [drp] | Freq: Every day | CUTANEOUS | 11 refills | Status: DC

## 2024-06-22 NOTE — Telephone Encounter (Signed)
 PA for Ciclopirox  was denied because of no trial and failure to or medical reason patient can not take oral medication.

## 2024-06-22 NOTE — Telephone Encounter (Signed)
 PA request for Ciclopirox  8% solution received from [facility]. PA submitted through CoverMyMeds and waiting on response.  TOBIAS BARNES  (Key: [id number]) PA Case ID #: [id number] Rx #: [id number]

## 2024-06-23 ENCOUNTER — Other Ambulatory Visit (HOSPITAL_BASED_OUTPATIENT_CLINIC_OR_DEPARTMENT_OTHER): Payer: Self-pay

## 2024-06-24 ENCOUNTER — Other Ambulatory Visit (HOSPITAL_BASED_OUTPATIENT_CLINIC_OR_DEPARTMENT_OTHER): Payer: Self-pay

## 2024-06-24 ENCOUNTER — Ambulatory Visit
Admission: RE | Admit: 2024-06-24 | Discharge: 2024-06-24 | Disposition: A | Discharge status: Home / Self Care | Payer: Self-pay | Source: Ambulatory Visit | Attending: Family Medicine | Admitting: Family Medicine

## 2024-06-24 DIAGNOSIS — Z1231 Encounter for screening mammogram for malignant neoplasm of breast: Secondary | ICD-10-CM

## 2024-06-27 ENCOUNTER — Other Ambulatory Visit (HOSPITAL_BASED_OUTPATIENT_CLINIC_OR_DEPARTMENT_OTHER): Payer: Self-pay

## 2024-12-14 ENCOUNTER — Other Ambulatory Visit (HOSPITAL_BASED_OUTPATIENT_CLINIC_OR_DEPARTMENT_OTHER): Payer: Self-pay

## 2024-12-14 MED ORDER — GOLYTELY 236 G PO SOLR
ORAL | 0 refills | Status: DC
  Filled 2024-12-14: qty 4000, 1d supply, fill #0

## 2025-01-28 ENCOUNTER — Telehealth: Admitting: Nurse Practitioner

## 2025-01-28 DIAGNOSIS — J329 Chronic sinusitis, unspecified: Secondary | ICD-10-CM

## 2025-01-28 DIAGNOSIS — B3731 Acute candidiasis of vulva and vagina: Secondary | ICD-10-CM

## 2025-01-28 DIAGNOSIS — J019 Acute sinusitis, unspecified: Secondary | ICD-10-CM

## 2025-01-28 DIAGNOSIS — J40 Bronchitis, not specified as acute or chronic: Secondary | ICD-10-CM

## 2025-01-28 DIAGNOSIS — B9689 Other specified bacterial agents as the cause of diseases classified elsewhere: Secondary | ICD-10-CM

## 2025-01-28 MED ORDER — FLUCONAZOLE 150 MG PO TABS: 150.0000 mg | ORAL_TABLET | Freq: Once | ORAL | 0 refills | Status: AC

## 2025-01-28 MED ORDER — PROMETHAZINE-DM 6.25-15 MG/5ML PO SYRP: 5.0000 mL | ORAL_SOLUTION | Freq: Four times a day (QID) | ORAL | 0 refills | Status: AC | PRN

## 2025-01-28 MED ORDER — AMOXICILLIN-POT CLAVULANATE 875-125 MG PO TABS: 1.0000 | ORAL_TABLET | Freq: Two times a day (BID) | ORAL | 0 refills | Status: AC

## 2025-01-28 NOTE — Patient Instructions (Signed)
 " Wanda Gross, thank you for joining [facility] Wanda Rouleau, Wanda Gross for today's virtual visit.  While this provider is not your primary care provider (PCP), if your PCP is located in our provider database this encounter information will be shared with them immediately following your visit.   A [employer] MyChart account gives you access to today's visit and all your visits, tests, and labs performed at [facility]  click here if you don't have a [facility] MyChart account or go to [email or web address]  Consent: (Patient) Wanda Gross provided verbal consent for this virtual visit at the beginning of the encounter.  Current Medications:  Current Outpatient Medications:    albuterol  (VENTOLIN  HFA) 108 (90 Base) MCG/ACT inhaler, Inhale 2 puffs into the lungs every 6 (six) hours as needed for wheezing or shortness of breath., Disp: 6.7 g, Rfl: 1   amoxicillin -clavulanate (AUGMENTIN ) 875-125 MG tablet, Take 1 tablet by mouth 2 (two) times daily for 7 days., Disp: 14 tablet, Rfl: 0   Budeson-Glycopyrrol-Formoterol  (BREZTRI  AEROSPHERE) 160-9-4.8 MCG/ACT AERO, Inhale 2 puffs into the lungs in the morning and at bedtime., Disp: 32.1 g, Rfl: 1   Cholecalciferol 1.25 MG (50000 UT) capsule, Take 50,000 Units by mouth daily., Disp: , Rfl:    esomeprazole  (NEXIUM ) 40 MG capsule, Take 1 capsule (40 mg total) by mouth daily., Disp: 30 capsule, Rfl: 5   fluconazole  (DIFLUCAN ) 150 MG tablet, Take 1 tablet (150 mg total) by mouth once for 1 dose., Disp: 1 tablet, Rfl: 0   fluticasone  (FLONASE ) 50 MCG/ACT nasal spray, 1 spray nasally twice daily until symptoms resolve, Disp: 16 g, Rfl: 1   levocetirizine (XYZAL  ALLERGY 24HR) 5 MG tablet, Take 1 tablet (5 mg total) by mouth every evening., Disp: 30 tablet, Rfl: 5   montelukast  (SINGULAIR ) 10 MG tablet, Take 1 tablet (10 mg total) by mouth at bedtime., Disp: 90 tablet, Rfl: 1   promethazine -dextromethorphan  (PROMETHAZINE -DM) 6.25-15 MG/5ML syrup, Take 5  mLs by mouth 4 (four) times daily as needed for cough., Disp: 118 mL, Rfl: 0   triamcinolone  cream (KENALOG ) 0.1 %, Apply 1 Application topically 2 (two) times daily., Disp: 30 g, Rfl: 3   Medications ordered in this encounter:  Meds ordered this encounter  Medications   amoxicillin -clavulanate (AUGMENTIN ) 875-125 MG tablet    Sig: Take 1 tablet by mouth 2 (two) times daily for 7 days.    Dispense:  14 tablet    Refill:  0   fluconazole  (DIFLUCAN ) 150 MG tablet    Sig: Take 1 tablet (150 mg total) by mouth once for 1 dose.    Dispense:  1 tablet    Refill:  0   promethazine -dextromethorphan  (PROMETHAZINE -DM) 6.25-15 MG/5ML syrup    Sig: Take 5 mLs by mouth 4 (four) times daily as needed for cough.    Dispense:  118 mL    Refill:  0     *If you need refills on other medications prior to your next appointment, please contact your pharmacy*  Follow-Up: Call back or seek an in-person evaluation if the symptoms worsen or if the condition fails to improve as anticipated.  [facility] [phone]  Other Instructions Based on what you have shared with me it looks like you have sinusitis.  Sinusitis is inflammation and infection in the sinus cavities of the head.  Based on your presentation I believe you most likely have Acute Bacterial Sinusitis.  This is an infection caused by bacteria and is treated  with antibiotics. You have been prescribed an antibiotic augmentin , a cough suppressant promethazine  DM cough syrup. I encourage you to continue using the flonase  nasal spray as well. I also recommend taking Mucinex  D, if you do not have glaucoma or high blood pressure, or plain Mucinex  over the counter. Saline nasal irrigations/rinses (Netti Pot or Aureliano Med rinse) 1-2 times a day are also beneficial to help treat the sinus symptoms; use bottled distilled water with these.  Please take your antibiotic in it's entirety, and do not stop it just because you are feeling better. Take it  with food.  If you develop worsening sinus pain, fever or notice severe headache and vision changes, or if symptoms are not better after completion of antibiotic, please schedule an appointment with a health care provider.    Sinus infections are not as easily transmitted as other respiratory infection, however we still recommend that you avoid close contact with loved ones, especially the very young and elderly.  Remember to wash your hands thoroughly throughout the day as this is the number one way to prevent the spread of infection!  Home Care: Only take medications as instructed by your medical team. Complete the entire course of an antibiotic. Do not take these medications with alcohol. A steam or ultrasonic humidifier can help congestion.  You can place a towel over your head and breathe in the steam from hot water coming from a faucet. Avoid close contacts especially the very young and the elderly. Cover your mouth when you cough or sneeze. Always remember to wash your hands.  Get Help Right Away If: You develop worsening fever or sinus pain. You develop a severe head ache or visual changes. Your symptoms persist after you have completed your treatment plan.      If you have been instructed to have an in-person evaluation today at a local Urgent Care facility, please use the link below. It will take you to a list of all of our available [facility], including address, phone number and hours of operation. Please do not delay care.  [facility]  If you or a family member do not have a primary care provider, use the link below to schedule a visit and establish care. When you choose a [facility] primary care physician or advanced practice provider, you gain a long-term partner in health. Find a Primary Care Provider  Learn more about [facility]'s in-office and virtual care options: [facility] - Get Care Now  "

## 2025-01-28 NOTE — Progress Notes (Signed)
 " Virtual Visit Consent   Wanda Gross, you are scheduled for a virtual visit with a [facility] provider today. Just as with appointments in the office, your consent must be obtained to participate. Your consent will be active for this visit and any virtual visit you may have with one of our providers in the next 365 days. If you have a MyChart account, a copy of this consent can be sent to you electronically.  As this is a virtual visit, video technology does not allow for your provider to perform a traditional examination. This may limit your provider's ability to fully assess your condition. If your provider identifies any concerns that need to be evaluated in person or the need to arrange testing (such as labs, EKG, etc.), we will make arrangements to do so. Although advances in technology are sophisticated, we cannot ensure that it will always work on either your end or our end. If the connection with a video visit is poor, the visit may have to be switched to a telephone visit. With either a video or telephone visit, we are not always able to ensure that we have a secure connection.  By engaging in this virtual visit, you consent to the provision of healthcare and authorize for your insurance to be billed (if applicable) for the services provided during this visit. Depending on your insurance coverage, you may receive a charge related to this service.  I need to obtain your verbal consent now. Are you willing to proceed with your visit today? Wanda Gross has provided verbal consent on 01/28/2025 for a virtual visit (video or telephone). Comer LULLA Rouleau, NP  Date: 01/28/2025 11:41 AM   Virtual Visit via Video Note   I, Comer LULLA Rouleau, connected with  Wanda Gross  ([id number], Oct 14, 1965) on 01/28/25 at 11:30 AM EST by a video-enabled telemedicine application and verified that I am speaking with the correct person using two identifiers.  Location: Patient: Virtual Visit Location  Patient: Home Provider: Virtual Visit Location Provider: Home Office   I discussed the limitations of evaluation and management by telemedicine and the availability of in person appointments. The patient expressed understanding and agreed to proceed.    History of Present Illness: Wanda Gross is a 60 y.o. who identifies as a female who was assigned female at birth, and is being seen today for concern for sinus infection.  Onset about two weeks ago  She has been having sinus congestion and green drainage  Lots of facial pressure/sinus pain Ear pressure/discomfort  She also feels like her teeth hurt but no particular one  She did have one episode where she lost her balance but this has not recurred in the last few weeks; tends to happen when she has sinus issues. No changes in vision, syncope, vision changes, weakness Hacking cough. Initially dry but she is able to get up some phlegm with it now, which is also green/yellow  No drainage from the ears or difficulties swallowing. No changes in hearing.  No fevers, body aches, chills, GI symptoms  No trouble with shortness of breath, wheezing, chest tightness. She is using her daily maintenance inhaler. Has not required increase use of her rescue inhaler.  Using Mucinex  DM and flonase  nasal spray. Still having difficulties with the cough despite the mucinex  DM.  She does tend to get yeast infections with abx use so requested diflucan  to have on hand. No current vaginal symptoms  HPI: HPI  Problems:  Patient  Active Problem List   Diagnosis Date Noted   Family history of colon cancer 06/17/2023   Moderate persistent asthma without complication 06/07/2023   Seasonal allergic rhinitis due to pollen 03/21/2018   Obesity (BMI 30-39.9) 02/07/2018   Vitamin D  deficiency 02/07/2018    Allergies: Allergies[1] Medications: Current Medications[2]  Observations/Objective: Patient is well-developed, well-nourished in no acute distress.  Resting  comfortably at home.  Head is normocephalic, atraumatic.  No labored breathing.  Speech is clear and coherent with logical content.  Patient is alert and oriented at baseline.   Assessment and Plan: 1. Acute bacterial rhinosinusitis (Primary) - amoxicillin -clavulanate (AUGMENTIN ) 875-125 MG tablet; Take 1 tablet by mouth 2 (two) times daily for 7 days.  Dispense: 14 tablet; Refill: 0  2. Sinobronchitis - promethazine -dextromethorphan  (PROMETHAZINE -DM) 6.25-15 MG/5ML syrup; Take 5 mLs by mouth 4 (four) times daily as needed for cough.  Dispense: 118 mL; Refill: 0  3. Vaginal candidiasis - fluconazole  (DIFLUCAN ) 150 MG tablet; Take 1 tablet (150 mg total) by mouth once for 1 dose.  Dispense: 1 tablet; Refill: 0  Given duration of symptoms, likely bacterial sinusitis with potential bronchitis vs upper airway cough due to irritation from postnasal drainage. Plan to treat with empiric abx course and supportive care measures. Provided with rx for diflucan  in the event she develops symptoms of a yeast infection. Aware to not take this unless symptoms occur. Side effect profiles reviewed. Asthma well-controlled at current state. Advised to continue asthma regimen and asthma action plan. Reviewed s/s necessitating in person evaluation.   Follow Up Instructions: I discussed the assessment and treatment plan with the patient. The patient was provided an opportunity to ask questions and all were answered. The patient agreed with the plan and demonstrated an understanding of the instructions.  A copy of instructions were sent to the patient via MyChart unless otherwise noted below.    The patient was advised to call back or seek an in-person evaluation if the symptoms worsen or if the condition fails to improve as anticipated.    Comer LULLA Rouleau, NP     [1] No Known Allergies [2]  Current Outpatient Medications:    albuterol  (VENTOLIN  HFA) 108 (90 Base) MCG/ACT inhaler, Inhale 2 puffs into the  lungs every 6 (six) hours as needed for wheezing or shortness of breath., Disp: 6.7 g, Rfl: 1   amoxicillin -clavulanate (AUGMENTIN ) 875-125 MG tablet, Take 1 tablet by mouth 2 (two) times daily for 7 days., Disp: 14 tablet, Rfl: 0   Budeson-Glycopyrrol-Formoterol  (BREZTRI  AEROSPHERE) 160-9-4.8 MCG/ACT AERO, Inhale 2 puffs into the lungs in the morning and at bedtime., Disp: 32.1 g, Rfl: 1   Cholecalciferol 1.25 MG (50000 UT) capsule, Take 50,000 Units by mouth daily., Disp: , Rfl:    esomeprazole  (NEXIUM ) 40 MG capsule, Take 1 capsule (40 mg total) by mouth daily., Disp: 30 capsule, Rfl: 5   fluconazole  (DIFLUCAN ) 150 MG tablet, Take 1 tablet (150 mg total) by mouth once for 1 dose., Disp: 1 tablet, Rfl: 0   fluticasone  (FLONASE ) 50 MCG/ACT nasal spray, 1 spray nasally twice daily until symptoms resolve, Disp: 16 g, Rfl: 1   levocetirizine (XYZAL  ALLERGY 24HR) 5 MG tablet, Take 1 tablet (5 mg total) by mouth every evening., Disp: 30 tablet, Rfl: 5   montelukast  (SINGULAIR ) 10 MG tablet, Take 1 tablet (10 mg total) by mouth at bedtime., Disp: 90 tablet, Rfl: 1   promethazine -dextromethorphan  (PROMETHAZINE -DM) 6.25-15 MG/5ML syrup, Take 5 mLs by mouth 4 (four) times daily as  needed for cough., Disp: 118 mL, Rfl: 0   triamcinolone  cream (KENALOG ) 0.1 %, Apply 1 Application topically 2 (two) times daily., Disp: 30 g, Rfl: 3  "

## 2025-06-21 ENCOUNTER — Encounter: Admitting: Family Medicine
